# Patient Record
Sex: Female | Born: 1950
Health system: Southern US, Community
[De-identification: ages and names within clinical notes are randomized; demographics above are authoritative.]

## PROBLEM LIST (undated history)

## (undated) DIAGNOSIS — I10 Essential (primary) hypertension: Secondary | ICD-10-CM

## (undated) DIAGNOSIS — M4800 Spinal stenosis, site unspecified: Secondary | ICD-10-CM

## (undated) DIAGNOSIS — F329 Major depressive disorder, single episode, unspecified: Secondary | ICD-10-CM

## (undated) DIAGNOSIS — F32A Depression, unspecified: Secondary | ICD-10-CM

## (undated) DIAGNOSIS — K219 Gastro-esophageal reflux disease without esophagitis: Secondary | ICD-10-CM

## (undated) HISTORY — PX: OTHER SURGICAL HISTORY: SHX169

## (undated) HISTORY — DX: Depression, unspecified: F32.A

## (undated) HISTORY — DX: Gastro-esophageal reflux disease without esophagitis: K21.9

---
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
## 2016-03-02 DIAGNOSIS — C189 Malignant neoplasm of colon, unspecified: Secondary | ICD-10-CM

## 2016-03-02 HISTORY — DX: Malignant neoplasm of colon, unspecified: C18.9

## 2019-08-23 DIAGNOSIS — E7800 Pure hypercholesterolemia, unspecified: Secondary | ICD-10-CM | POA: Diagnosis not present

## 2019-08-23 DIAGNOSIS — F322 Major depressive disorder, single episode, severe without psychotic features: Secondary | ICD-10-CM | POA: Diagnosis not present

## 2019-08-23 DIAGNOSIS — Z79899 Other long term (current) drug therapy: Secondary | ICD-10-CM | POA: Diagnosis not present

## 2019-08-23 DIAGNOSIS — G4700 Insomnia, unspecified: Secondary | ICD-10-CM | POA: Diagnosis not present

## 2019-09-07 DIAGNOSIS — E7800 Pure hypercholesterolemia, unspecified: Secondary | ICD-10-CM | POA: Diagnosis not present

## 2019-09-07 DIAGNOSIS — Z23 Encounter for immunization: Secondary | ICD-10-CM | POA: Diagnosis not present

## 2019-09-07 DIAGNOSIS — Z79899 Other long term (current) drug therapy: Secondary | ICD-10-CM | POA: Diagnosis not present

## 2019-09-22 DIAGNOSIS — M5416 Radiculopathy, lumbar region: Secondary | ICD-10-CM | POA: Diagnosis not present

## 2019-11-09 DIAGNOSIS — G5601 Carpal tunnel syndrome, right upper limb: Secondary | ICD-10-CM | POA: Diagnosis not present

## 2019-11-09 DIAGNOSIS — Z0000 Encounter for general adult medical examination without abnormal findings: Secondary | ICD-10-CM | POA: Diagnosis not present

## 2019-11-09 DIAGNOSIS — K219 Gastro-esophageal reflux disease without esophagitis: Secondary | ICD-10-CM | POA: Diagnosis not present

## 2020-01-06 ENCOUNTER — Encounter: Payer: Self-pay | Admitting: Hematology

## 2020-01-06 ENCOUNTER — Telehealth: Payer: Self-pay | Admitting: Hematology

## 2020-01-06 NOTE — Telephone Encounter (Signed)
Received a new pt referral from Dr. Olen Pel for hx of rectal cancer. I attempted to call Penny Guzman but her line was busy. I schedule her to see Dr. Burr Medico on 2/18 at 3pm. Letter mailed.

## 2020-01-14 ENCOUNTER — Telehealth: Payer: Self-pay

## 2020-01-14 NOTE — Telephone Encounter (Signed)
Ms Shenton called stating that she "did not need this appointment" she just needs the doctor to order her ct scans.  I told her that she would need to see Dr. Burr Medico to establish as a patient for Dr. Burr Medico to order the Ct scans.  She stated that was just wasting her money.

## 2020-01-20 ENCOUNTER — Ambulatory Visit: Payer: Self-pay | Admitting: Hematology

## 2020-01-24 DIAGNOSIS — R1310 Dysphagia, unspecified: Secondary | ICD-10-CM | POA: Diagnosis not present

## 2020-01-24 DIAGNOSIS — Z85048 Personal history of other malignant neoplasm of rectum, rectosigmoid junction, and anus: Secondary | ICD-10-CM | POA: Diagnosis not present

## 2020-01-24 DIAGNOSIS — K219 Gastro-esophageal reflux disease without esophagitis: Secondary | ICD-10-CM | POA: Diagnosis not present

## 2020-02-01 ENCOUNTER — Other Ambulatory Visit: Payer: Self-pay | Admitting: Physician Assistant

## 2020-02-01 DIAGNOSIS — R1310 Dysphagia, unspecified: Secondary | ICD-10-CM

## 2020-02-03 ENCOUNTER — Telehealth: Payer: Self-pay | Admitting: Nurse Practitioner

## 2020-02-03 ENCOUNTER — Telehealth: Payer: Self-pay | Admitting: *Deleted

## 2020-02-03 ENCOUNTER — Ambulatory Visit: Payer: Self-pay | Admitting: Nurse Practitioner

## 2020-02-03 NOTE — Telephone Encounter (Signed)
I received a msg to r/s Penny Guzman's appt. She's been cld and rescheduled to see Lacie on 3/22 at 1:45pm.

## 2020-02-03 NOTE — Telephone Encounter (Signed)
Patient returned my call and confirmed that she understood that her appointment today is being rescheduled.  I informed her that someone from scheduling will be contacting her to reschedule her appointment.  Patient was understanding and appreciative of the call.

## 2020-02-03 NOTE — Telephone Encounter (Signed)
I called patient two times this am with no answer, to let her know that we need to reschedule her appointment with NP at 1:45pm today.  After second attempt, I left a voice mail that we need to reschedule her appointment and to call me for more information.

## 2020-02-16 ENCOUNTER — Other Ambulatory Visit: Payer: Medicare Other

## 2020-02-17 ENCOUNTER — Other Ambulatory Visit: Payer: Medicare Other

## 2020-02-21 ENCOUNTER — Inpatient Hospital Stay: Payer: Medicare Other

## 2020-02-21 ENCOUNTER — Other Ambulatory Visit: Payer: Self-pay

## 2020-02-21 ENCOUNTER — Encounter: Payer: Self-pay | Admitting: Nurse Practitioner

## 2020-02-21 ENCOUNTER — Inpatient Hospital Stay: Payer: Medicare Other | Attending: Hematology | Admitting: Nurse Practitioner

## 2020-02-21 VITALS — BP 153/95 | HR 80 | Temp 98.2°F | Resp 20 | Wt 178.7 lb

## 2020-02-21 DIAGNOSIS — Z85048 Personal history of other malignant neoplasm of rectum, rectosigmoid junction, and anus: Secondary | ICD-10-CM

## 2020-02-21 DIAGNOSIS — Z79899 Other long term (current) drug therapy: Secondary | ICD-10-CM | POA: Insufficient documentation

## 2020-02-21 DIAGNOSIS — K219 Gastro-esophageal reflux disease without esophagitis: Secondary | ICD-10-CM | POA: Diagnosis not present

## 2020-02-21 DIAGNOSIS — Z9221 Personal history of antineoplastic chemotherapy: Secondary | ICD-10-CM | POA: Insufficient documentation

## 2020-02-21 DIAGNOSIS — N895 Stricture and atresia of vagina: Secondary | ICD-10-CM | POA: Insufficient documentation

## 2020-02-21 DIAGNOSIS — Z1231 Encounter for screening mammogram for malignant neoplasm of breast: Secondary | ICD-10-CM

## 2020-02-21 DIAGNOSIS — C210 Malignant neoplasm of anus, unspecified: Secondary | ICD-10-CM | POA: Diagnosis not present

## 2020-02-21 DIAGNOSIS — F329 Major depressive disorder, single episode, unspecified: Secondary | ICD-10-CM | POA: Diagnosis not present

## 2020-02-21 DIAGNOSIS — G4700 Insomnia, unspecified: Secondary | ICD-10-CM | POA: Insufficient documentation

## 2020-02-21 DIAGNOSIS — Z923 Personal history of irradiation: Secondary | ICD-10-CM | POA: Insufficient documentation

## 2020-02-21 LAB — CMP (CANCER CENTER ONLY)
ALT: 38 U/L (ref 0–44)
AST: 27 U/L (ref 15–41)
Albumin: 3.7 g/dL (ref 3.5–5.0)
Alkaline Phosphatase: 106 U/L (ref 38–126)
Anion gap: 11 (ref 5–15)
BUN: 17 mg/dL (ref 8–23)
CO2: 24 mmol/L (ref 22–32)
Calcium: 9.3 mg/dL (ref 8.9–10.3)
Chloride: 107 mmol/L (ref 98–111)
Creatinine: 0.77 mg/dL (ref 0.44–1.00)
GFR, Est AFR Am: >60 mL/min (ref >60)
GFR, Estimated: >60 mL/min (ref >60)
Glucose, Bld: 81 mg/dL (ref 70–99)
Potassium: 4.2 mmol/L (ref 3.5–5.1)
Sodium: 142 mmol/L (ref 135–145)
Total Bilirubin: 0.7 mg/dL (ref 0.3–1.2)
Total Protein: 6.6 g/dL (ref 6.5–8.1)

## 2020-02-21 LAB — CBC WITH DIFFERENTIAL (CANCER CENTER ONLY)
Abs Immature Granulocytes: 0.01 10*3/uL (ref 0.00–0.07)
Basophils Absolute: 0 10*3/uL (ref 0.0–0.1)
Basophils Relative: 1 %
Eosinophils Absolute: 0.2 10*3/uL (ref 0.0–0.5)
Eosinophils Relative: 5 %
HCT: 43 % (ref 36.0–46.0)
Hemoglobin: 14.2 g/dL (ref 12.0–15.0)
Immature Granulocytes: 0 %
Lymphocytes Relative: 23 %
Lymphs Abs: 1.1 10*3/uL (ref 0.7–4.0)
MCH: 31.3 pg (ref 26.0–34.0)
MCHC: 33 g/dL (ref 30.0–36.0)
MCV: 94.9 fL (ref 80.0–100.0)
Monocytes Absolute: 0.6 10*3/uL (ref 0.1–1.0)
Monocytes Relative: 13 %
Neutro Abs: 2.7 10*3/uL (ref 1.7–7.7)
Neutrophils Relative %: 58 %
Platelet Count: 288 10*3/uL (ref 150–400)
RBC: 4.53 MIL/uL (ref 3.87–5.11)
RDW: 13 % (ref 11.5–15.5)
WBC Count: 4.6 10*3/uL (ref 4.0–10.5)
nRBC: 0 % (ref 0.0–0.2)

## 2020-02-21 NOTE — Progress Notes (Addendum)
[facility]  [phone] Fax:([phone] [place] Note   Patient Care Team: Orpah Melter, MD as PCP - General (Family Medicine) Date of Service: 02/21/2020   CHIEF COMPLAINTS/PURPOSE OF CONSULTATION:  H/o anal cancer in 2017, Referred by Dr. Orpah Melter of [facility]   Oncology History  Anal cancer [facility])  03/15/2016 Procedure   FINDINGS:  Approximately 4cm nodular inflamed area in rectum just above  dentate line.  Biopsies taken with cold forceps.  To rule out  malignancy. Site marked with ink.  Rest of colon appeared normal IMPRESSION: Nodular inflamed area just above dentate line.  To  rule out malignancy   03/15/2016 Initial Biopsy   FINAL PATHOLOGIC DIAGNOSIS RECTUM, BIOPSY: -AT LEAST  SQUAMOUS CELL CARCINOMA IN SITU, SEE COMMENT  Comment There is no definite evidence of invasion in the sections examined. Clinical correlation and a repeat biopsy may be done if there is a suspicion for invasive carcinoma.   03/15/2016 Initial Diagnosis   Anal cancer ([place])   03/15/2016 Cancer Staging   Cancer Staging Anal cancer [facility]) Staging form: Anus, AJCC 8th Edition - Clinical stage from 02/22/2020: Stage IIIC (cT3, cN1, cM0) - Signed by Alla Feeling, NP on 02/22/2020    03/19/2016 Imaging   Staging CT CAP IMPRESSION 1. Gallstones. 2. Aortic atherosclerosis and branches, mild. 3. Degenerative disc disease L2-S1. 4. Otherwise negative CT chest abdomen and pelvis.   03/26/2016 PET scan   IMPRESSION 1. Intense uptake noted in the distal rectum and anus consistent with malignancy. 2. 1 cm mildly hypermetabolic (max SUV of 2.0) with a lymph node in the right mesorectal fascia which could represent reactive versus pathologic node.   04/05/2016 Imaging   Local staging pelvic MRI IMPRESSION A 5 cm anal tumor with invasion of the sphincter complex and lower rectum as described above (T3). A suspicious 10 mm round mesorectal lymph  node (N1).   04/23/2016 - 05/28/2016 Radiation Therapy   RADIATION THERAPY: Completed 25 of 25 fractions (50 Gy of a planned 50 Gy to anus+pelvis+groins)   04/23/2016 - 05/21/2016 Chemotherapy   CONCURRENT THERAPY: 5-FU and Mitomycin C x 2 cycles     08/14/2016 PET scan   IMPRESSION (post chemoRT PET scan) 1.  There is no definite sign of metabolically active malignancy. Metabolic activity at the site of  previously seen hypermetabolic anal mass is mild and diffuse and appears physiologic. Previously seen  hypermetabolic right sacral node is no longer present.  2.  Incidental CT findings including: Coronary artery and aortic atherosclerosis, cholelithiasis .   09/05/2017 Imaging   Surveillance CT CAP IMPRESSION No evidence of metastatic disease.   09/11/2018 Procedure   Colonoscopy Data Digital Rectal Exam: Normal Quality of Prep: Good Extent of procedure: Cecum Withdrawal Time (Cecum to Anus): 7 mins Retroflexed View Performed: Yes  Colonoscopy Findings/Specimens Bottle #: 1 Location: Descending Colon Specimen Type: Polyp Total number of polyps: 1 Polyp Size (mm): less than 5 mm Type: Sessile Tissue extracted by: Cold Forceps Extraction Comments: (09/11/18 cbs/ka 1350 hx of polyps and anal  ca)     09/14/2018 Pathology Results   FINAL PATHOLOGIC DIAGNOSIS COLON, DESCENDING POLYP, MUCOSAL BIOPSY:       BENIGN COLONIC MUCOSA WITH REACTIVE LYMPHOID AGGREGATE. Comment No micro-organisms, active colitis, dysplasia, or neoplasia seen.   11/13/2018 Imaging   Surveillance CT CAP  Stable CT examination of the chest, abdomen and pelvis compared to 11/05/2017 without metastatic process identified. Clinical follow up according  to existing care plan.  Cholelithiasis.  Aortic atherosclerosis.   02/22/2020 Cancer Staging   Staging form: Anus, AJCC 8th Edition - Clinical stage from 02/22/2020: Stage IIIC (cT3, cN1, cM0) - Signed by Alla Feeling, NP on 02/22/2020    HISTORY OF  PRESENTING ILLNESS:  Penny Guzman 69 y.o. female is here because of history of anal cancer treated at [facility]. She underwent screening colonoscopy on 03/15/2016. There was a 4 cm nodular inflamed areas in the rectum just above the dentate line. Path showed at least squamous cell carcinoma in situ, no definite evidence of invasion in the sections examined. Staging CT CAP on 03/19/2016 showed no distant metastasis. PET on 03/26/2016 showed intense hypermetabolic uptake in the distal rectum and anus, consistent with known malignancy and 1 cm mildly hypermetabolic LN in the right mesorectal fascia. Pelvic MRI on 04/05/2016 showed a 5 cm anal tumor with invasion of the sphincter complex and lower rectum and suspicious 10 mm round mesorectal node, locally staged T3N1, stage IIIA. She completed chemoradiation with 5FU and Mitomycin. She received 50 Gy in 25 fractions to the anus/pelvis/groins from 04/23/2016 to 05/28/2016. PET on 08/14/2016 showed no signs of metabolically active malignancy and resolution of the previous right sacral node. Surveillance CT CAP on 09/05/2017 showed NED. Colonoscopy on 09/11/2018 revealed a normal DRE and 1 polyp in the descending colon. Path showed benign colonic mucosa with reactive lymphoid aggregate. Surveillance CT CAP on 11/13/2018 showed no recurrence or metastatic cancer.   Socially, she relocated to [place], [place] from [place] in 07/2019 during COVID-19 pandemic to be closer to family. She lives with her spouse. She is a retired Pharmacist, hospital. She had 3 children, a daughter is deceased from AML at age 15, a son and daughter are living and healthy. She is UTD on PAP smear in 2019. Last mammogram in 2018. She has family history of cancer in father with bladder, mother with skin cancer. She drinks alcohol socially with 3 drinks per week; denies tobacco or other drug history.  Ms. Bona presents today by herself. She is doing well. Her appetite and weight are  normal for her. She is fatigued but attributes that to cross-country move during global pandemic. Her bowels are consistent, takes colace. Denies n/v/d. Has some mucus leakage and not full control of gas, no bleeding. Denies rectal/anal pain but has itching. Overactive bladder is at baseline. She has vaginal stenosis. Mood is good lately. Denies recent fever, chills, cough, chest pain, dyspnea, swelling.   MEDICAL HISTORY:  Past Medical History:  Diagnosis Date  . Colon cancer ([place]) 03/2016   anal cancer  . Depression   . GERD (gastroesophageal reflux disease)     SURGICAL HISTORY: Past Surgical History:  Procedure Laterality Date  . ovarian cysts      SOCIAL HISTORY: Social History   Socioeconomic History  . Marital status: Married    Spouse name: Not on file  . Number of children: Not on file  . Years of education: Not on file  . Highest education level: Not on file  Occupational History  . Occupation: Pharmacist, hospital    Comment: Retired  Tobacco Use  . Smoking status: Never Smoker  . Smokeless tobacco: Never Used  Substance and Sexual Activity  . Alcohol use: Yes    Comment: 3 drinks per week   . Drug use: Never  . Sexual activity: Not Currently  Other Topics Concern  . Not on file  Social History Narrative  .  Not on file   Social Determinants of Health   Financial Resource Strain:   . Difficulty of Paying Living Expenses:   Food Insecurity:   . Worried About Charity fundraiser in the Last Year:   . Arboriculturist in the Last Year:   Transportation Needs:   . Film/video editor (Medical):   Marland Kitchen Lack of Transportation (Non-Medical):   Physical Activity:   . Days of Exercise per Week:   . Minutes of Exercise per Session:   Stress:   . Feeling of Stress :   Social Connections:   . Frequency of Communication with Friends and Family:   . Frequency of Social Gatherings with Friends and Family:   . Attends Religious Services:   . Active Member of Clubs or  Organizations:   . Attends Archivist Meetings:   Marland Kitchen Marital Status:   Intimate Partner Violence:   . Fear of Current or Ex-Partner:   . Emotionally Abused:   Marland Kitchen Physically Abused:   . Sexually Abused:     FAMILY HISTORY: Family History  Problem Relation Age of Onset  . Cancer Mother        skin  . Cancer Father        bladder  . Cancer Daughter        AML, deceased age 110    ALLERGIES:  is allergic to codeine; propoxyphene; and triamcinolone.  MEDICATIONS:  Current Outpatient Medications  Medication Sig Dispense Refill  . aspirin 81 MG chewable tablet Chew by mouth daily.    Marland Kitchen atorvastatin (LIPITOR) 20 MG tablet Take 20 mg by mouth daily.    Marland Kitchen augmented betamethasone dipropionate (DIPROLENE-AF) 0.05 % cream Apply 30 g topically as needed.    Marland Kitchen b complex vitamins tablet Take 1 tablet by mouth daily.    . calcium-vitamin D (OSCAL WITH D) 500-200 MG-UNIT tablet Take 1 tablet by mouth.    . co-enzyme Q-10 30 MG capsule Take 30 mg by mouth 3 (three) times daily.    Marland Kitchen FLUoxetine (PROZAC) 10 MG capsule Take 10 mg by mouth daily.    Marland Kitchen ibuprofen (ADVIL) 200 MG tablet Take 200 mg by mouth daily. 250 mg daily    . omeprazole (PRILOSEC) 20 MG capsule Take 20 mg by mouth daily.    . temazepam (RESTORIL) 15 MG capsule Take 15 mg by mouth at bedtime as needed.     No current facility-administered medications for this visit.    REVIEW OF SYSTEMS:   Constitutional: Denies fevers, chills or abnormal night sweats Eyes: Denies blurriness of vision, double vision or watery eyes Ears, nose, mouth, throat, and face: Denies mucositis or sore throat Respiratory: Denies cough, dyspnea or wheezes Cardiovascular: Denies palpitation, chest discomfort or lower extremity swelling Gastrointestinal:  Denies nausea, vomiting or change in bowel habits (+) constipation (+) rectal itching (+) gas (+) GERD GU/GYN: (+) overactive bladder (+) vaginal stenosis  Skin: Denies abnormal skin  rashes Lymphatics: Denies new lymphadenopathy or easy bruising Neurological:Denies numbness, tingling or new weaknesses Behavioral/Psych: Mood is stable, no new changes  All other systems were reviewed with the patient and are negative.  PHYSICAL EXAMINATION: ECOG PERFORMANCE STATUS: 0 - Asymptomatic  Vitals:   02/21/20 1422  BP: (!) 153/95  Pulse: 80  Resp: 20  Temp: 98.2 F (36.8 C)  SpO2: 98%   Filed Weights   02/21/20 1422  Weight: 178 lb 11.2 oz (81.1 kg)    GENERAL:alert, no distress and comfortable  SKIN: no rash EYES: sclera clear LYMPH:  no palpable cervical, supraclavicular, or inguinal lymphadenopathy LUNGS: clear with normal breathing effort HEART: regular rate & rhythm, no lower extremity edema ABDOMEN: abdomen soft, non-tender and normal bowel sounds RECTAL: external exam shows no mass. Skin intact. Normal sphincter tone. No internal anorectal mass that I could appreciate. No blood on glove.  PSYCH: alert & oriented x 3 with fluent speech NEURO: no focal motor/sensory deficits  LABORATORY DATA:  I have reviewed the data as listed CBC Latest Ref Rng & Units 02/21/2020  WBC 4.0 - 10.5 K/uL 4.6  Hemoglobin 12.0 - 15.0 g/dL 14.2  Hematocrit 36.0 - 46.0 % 43.0  Platelets 150 - 400 K/uL 288   CMP Latest Ref Rng & Units 02/21/2020  Glucose 70 - 99 mg/dL 81  BUN 8 - 23 mg/dL 17  Creatinine 0.44 - 1.00 mg/dL 0.77  Sodium 135 - 145 mmol/L 142  Potassium 3.5 - 5.1 mmol/L 4.2  Chloride 98 - 111 mmol/L 107  CO2 22 - 32 mmol/L 24  Calcium 8.9 - 10.3 mg/dL 9.3  Total Protein 6.5 - 8.1 g/dL 6.6  Total Bilirubin 0.3 - 1.2 mg/dL 0.7  Alkaline Phos 38 - 126 U/L 106  AST 15 - 41 U/L 27  ALT 0 - 44 U/L 38     RADIOGRAPHIC STUDIES: I have personally reviewed the radiological images as listed and agreed with the findings in the report. No results found.  ASSESSMENT & PLAN: 69 yo female with   1. H/o squamous cell carcinoma of the anus, cT3N1 stage IIIA -We  reviewed her medical record in detail including colonoscopy, biopsy, and imaging. She was diagnosed with clinical T3N1 locally advanced anal cancer in 03/2016 on routine screening colonoscopy. She underwent curative chemoRT with 5FU/mitomycin. Follow up imaging and colonoscopy (2019) showed no evidence of residual or recurrent disease.  -She is on surveillance, remains NED, last surveillance CT CAP on 11/2018.  -Ms. Paccione is doing well. CBC and CMP are normal today. Physical exam including rectal shows no concern for recurrence.  -She has vaginal stenosis from previous radiation, PT was not able to help her in the past. I have referred her to Gyn. -For her h/o stage IIIA anal cancer, she is being referred for last surveillance CT CAP.  -she will return for lab and f/u in 6 months to continue surveillance.   2. GERD, HL, Depression, insomnia -Continue medical management and f/u with PCP  -Mood is stable   3. Health maintenance -I encouraged her to remain active with physical exercise and healthy diet and lifestyle.  -She is UTD on PAP smear. Last mammogram in 2018 in [place]. I reviewed rationale for annual screening. She agrees to repeat mammogram. I placed the order today.  -She has been referred to GI by her PCP Dr. Olen Pel at [place]: -Medical record reviewed -Lab today, WNL  -Surveillance CT CAP in 1-2 weeks, will call her after to review results -Referral to Ob/Gyn for vaginal stenosis  -Referred for screening mammogram, patient to call breast center  -Lab and surveillance visit in 6 months    Orders Placed This Encounter  Procedures  . CT Abdomen Pelvis W Contrast    Standing Status:   Future    Standing Expiration Date:   02/20/2021    Order Specific Question:   ** REASON FOR EXAM (FREE TEXT)    Answer:   Surveillance, diagnosed 03/2016 s/p chemoradiation    Order Specific Question:  If indicated for the ordered procedure, I authorize the administration of contrast media  per Radiology protocol    Answer:   Yes    Order Specific Question:   Preferred imaging location?    Answer:   [facility]    Order Specific Question:   Is Oral Contrast requested for this exam?    Answer:   Yes, Per Radiology protocol    Order Specific Question:   Radiology Contrast Protocol - do NOT remove file path    Answer:   \\[email or web address]  . CT Chest W Contrast    Standing Status:   Future    Standing Expiration Date:   02/20/2021    Order Specific Question:   ** REASON FOR EXAM (FREE TEXT)    Answer:   surveillance    Order Specific Question:   If indicated for the ordered procedure, I authorize the administration of contrast media per Radiology protocol    Answer:   Yes    Order Specific Question:   Preferred imaging location?    Answer:   [facility]    Order Specific Question:   Radiology Contrast Protocol - do NOT remove file path    Answer:   \\[email or web address]  . MM 3D SCREEN BREAST BILATERAL    Standing Status:   Future    Standing Expiration Date:   04/22/2021    Order Specific Question:   Reason for Exam (SYMPTOM  OR DIAGNOSIS REQUIRED)    Answer:   overdue screening mammogram, last 2018 in [place] Specific Question:   Preferred imaging location?    Answer:   [facility]  . CBC with Differential (Cancer Center Only)    Standing Status:   Future    Number of Occurrences:   1    Standing Expiration Date:   02/20/2021  . CMP ([place] only)    Standing Status:   Future    Number of Occurrences:   1    Standing Expiration Date:   02/20/2021  . Ambulatory referral to Obstetrics / Gynecology    Referral Priority:   Routine    Referral Type:   Consultation    Referral Reason:   Specialty Services Required    Requested Specialty:   Obstetrics and Gynecology    Number of Visits Requested:   1    All questions were answered. The patient knows to call the clinic with any  problems, questions or concerns.     Alla Feeling, NP 02/22/2020   Addendum  I have seen the patient, examined her. I agree with the assessment and and plan and have edited the notes.   68 yo female with T3N1M0 stage IIIA anal cancer diagnosed in May 2017, s/p concurrent chemoRT and achieved complete response, on surveillance. She is clinically doing well, no concerns for recurrence. Will do lab and last surveillance CT CAP with contrast in next few weeks. RTC in 6 months.   Truitt Merle  02/21/2020

## 2020-02-22 ENCOUNTER — Encounter: Payer: Self-pay | Admitting: Nurse Practitioner

## 2020-02-22 ENCOUNTER — Telehealth: Payer: Self-pay | Admitting: Nurse Practitioner

## 2020-02-22 DIAGNOSIS — C210 Malignant neoplasm of anus, unspecified: Secondary | ICD-10-CM | POA: Insufficient documentation

## 2020-02-22 NOTE — Telephone Encounter (Signed)
Scheduled appt per 3/22 los.  Sent a message to HIM pool to get a calendar mailed out.

## 2020-02-23 ENCOUNTER — Telehealth: Payer: Self-pay | Admitting: *Deleted

## 2020-02-23 NOTE — Telephone Encounter (Signed)
-----   Message from Alla Feeling, NP sent at 02/22/2020  4:51 PM EDT ----- Please let her know CBC and CMP all normal, no concerns with her labs.  Thanks, Regan Rakers

## 2020-02-23 NOTE — Telephone Encounter (Signed)
Per Cira Rue NP, called to make pt aware of normal labs of CBC and CMP. Voice message left on pt personal cell. Advised to call office with any questions or concerns.

## 2020-03-08 DIAGNOSIS — G5600 Carpal tunnel syndrome, unspecified upper limb: Secondary | ICD-10-CM | POA: Diagnosis not present

## 2020-03-31 ENCOUNTER — Ambulatory Visit (HOSPITAL_COMMUNITY)
Admission: RE | Admit: 2020-03-31 | Discharge: 2020-03-31 | Disposition: A | Discharge status: Home / Self Care | Payer: Medicare Other | Source: Ambulatory Visit | Attending: Nurse Practitioner | Admitting: Nurse Practitioner

## 2020-03-31 ENCOUNTER — Other Ambulatory Visit: Payer: Self-pay

## 2020-03-31 DIAGNOSIS — C210 Malignant neoplasm of anus, unspecified: Secondary | ICD-10-CM | POA: Diagnosis not present

## 2020-03-31 DIAGNOSIS — Z85048 Personal history of other malignant neoplasm of rectum, rectosigmoid junction, and anus: Secondary | ICD-10-CM | POA: Insufficient documentation

## 2020-03-31 DIAGNOSIS — K8020 Calculus of gallbladder without cholecystitis without obstruction: Secondary | ICD-10-CM | POA: Diagnosis not present

## 2020-03-31 DIAGNOSIS — Z923 Personal history of irradiation: Secondary | ICD-10-CM | POA: Diagnosis not present

## 2020-03-31 DIAGNOSIS — I2510 Atherosclerotic heart disease of native coronary artery without angina pectoris: Secondary | ICD-10-CM | POA: Insufficient documentation

## 2020-03-31 DIAGNOSIS — I700 Atherosclerosis of aorta: Secondary | ICD-10-CM | POA: Insufficient documentation

## 2020-03-31 MED ORDER — SODIUM CHLORIDE (PF) 0.9 % IJ SOLN
INTRAMUSCULAR | Status: AC
  Filled 2020-03-31: qty 50

## 2020-03-31 MED ORDER — IOHEXOL 300 MG/ML  SOLN
100.0000 mL | Freq: Once | INTRAMUSCULAR | Status: AC | PRN
  Administered 2020-03-31: 11:00:00 100 mL via INTRAVENOUS

## 2020-04-03 ENCOUNTER — Telehealth: Payer: Self-pay | Admitting: *Deleted

## 2020-04-03 NOTE — Telephone Encounter (Signed)
Per Cira Rue, NP, called to make pt aware of negative CT CAP for recurrent or metastatic anal cancer. Advised to f/u as scheduled for 9/21 and/or sooner if there were and concerns. Pt verbalized understanding and was appreciative

## 2020-04-03 NOTE — Telephone Encounter (Signed)
-----   Message from Alla Feeling, NP sent at 04/02/2020  9:15 PM EDT ----- Please let her know CT CAP is negative for recurrent or metastatic anal cancer. We'll see her back in 08/2020 as planned, or sooner if she has concerns.   Thanks, Regan Rakers

## 2020-04-05 ENCOUNTER — Ambulatory Visit
Admission: RE | Admit: 2020-04-05 | Discharge: 2020-04-05 | Disposition: A | Discharge status: Home / Self Care | Payer: Medicare Other | Source: Ambulatory Visit | Attending: Nurse Practitioner | Admitting: Nurse Practitioner

## 2020-04-05 ENCOUNTER — Other Ambulatory Visit: Payer: Self-pay

## 2020-04-05 DIAGNOSIS — Z1231 Encounter for screening mammogram for malignant neoplasm of breast: Secondary | ICD-10-CM | POA: Diagnosis not present

## 2020-06-02 DIAGNOSIS — F418 Other specified anxiety disorders: Secondary | ICD-10-CM | POA: Diagnosis not present

## 2020-06-02 DIAGNOSIS — M5416 Radiculopathy, lumbar region: Secondary | ICD-10-CM | POA: Diagnosis not present

## 2020-06-20 DIAGNOSIS — M5136 Other intervertebral disc degeneration, lumbar region: Secondary | ICD-10-CM | POA: Diagnosis not present

## 2020-07-29 DIAGNOSIS — M5136 Other intervertebral disc degeneration, lumbar region: Secondary | ICD-10-CM | POA: Diagnosis not present

## 2020-08-18 ENCOUNTER — Other Ambulatory Visit: Payer: Self-pay | Admitting: Physical Medicine and Rehabilitation

## 2020-08-18 DIAGNOSIS — M545 Low back pain, unspecified: Secondary | ICD-10-CM

## 2020-08-23 ENCOUNTER — Other Ambulatory Visit: Payer: Self-pay | Admitting: Nurse Practitioner

## 2020-08-23 DIAGNOSIS — Z85048 Personal history of other malignant neoplasm of rectum, rectosigmoid junction, and anus: Secondary | ICD-10-CM

## 2020-08-23 NOTE — Progress Notes (Signed)
[place]   [phone] Fax:([phone]) [phone]   Clinic Follow up Note   Patient Care Team: Orpah Melter, MD as PCP - General (Family Medicine)  Date of Service:  08/24/2020  CHIEF COMPLAINT: F/u of H/o anal cancer   SUMMARY OF ONCOLOGIC HISTORY: Oncology History Overview Note  Cancer Staging Anal cancer [facility]) Staging form: Anus, AJCC 8th Edition - Clinical stage from 02/22/2020: Stage IIIC (cT3, cN1, cM0) - Signed by Alla Feeling, NP on 02/22/2020    Anal cancer ([place])  03/15/2016 Procedure   FINDINGS:  Approximately 4cm nodular inflamed area in rectum just above  dentate line.  Biopsies taken with cold forceps.  To rule out  malignancy. Site marked with ink.  Rest of colon appeared normal IMPRESSION: Nodular inflamed area just above dentate line.  To  rule out malignancy   03/15/2016 Initial Biopsy   FINAL PATHOLOGIC DIAGNOSIS RECTUM, BIOPSY: -AT LEAST  SQUAMOUS CELL CARCINOMA IN SITU, SEE COMMENT  Comment There is no definite evidence of invasion in the sections examined. Clinical correlation and a repeat biopsy may be done if there is a suspicion for invasive carcinoma.   03/15/2016 Initial Diagnosis   Anal cancer ([place])   03/15/2016 Cancer Staging   Cancer Staging Anal cancer [facility]) Staging form: Anus, AJCC 8th Edition - Clinical stage from 02/22/2020: Stage IIIC (cT3, cN1, cM0) - Signed by Alla Feeling, NP on 02/22/2020    03/19/2016 Imaging   Staging CT CAP IMPRESSION 1. Gallstones. 2. Aortic atherosclerosis and branches, mild. 3. Degenerative disc disease L2-S1. 4. Otherwise negative CT chest abdomen and pelvis.   03/26/2016 PET scan   IMPRESSION 1. Intense uptake noted in the distal rectum and anus consistent with malignancy. 2. 1 cm mildly hypermetabolic (max SUV of 2.0) with a lymph node in the right mesorectal fascia which could represent reactive versus pathologic node.   04/05/2016 Imaging   Local staging pelvic MRI  IMPRESSION A 5 cm anal tumor with invasion of the sphincter complex and lower rectum as described above (T3). A suspicious 10 mm round mesorectal lymph node (N1).   04/23/2016 - 05/28/2016 Radiation Therapy   RADIATION THERAPY: Completed 25 of 25 fractions (50 Gy of a planned 50 Gy to anus+pelvis+groins)   04/23/2016 - 05/21/2016 Chemotherapy   CONCURRENT THERAPY: 5-FU and Mitomycin C x 2 cycles     08/14/2016 PET scan   IMPRESSION (post chemoRT PET scan) 1.  There is no definite sign of metabolically active malignancy. Metabolic activity at the site of  previously seen hypermetabolic anal mass is mild and diffuse and appears physiologic. Previously seen  hypermetabolic right sacral node is no longer present.  2.  Incidental CT findings including: Coronary artery and aortic atherosclerosis, cholelithiasis .   09/05/2017 Imaging   Surveillance CT CAP IMPRESSION No evidence of metastatic disease.   09/11/2018 Procedure   Colonoscopy Data Digital Rectal Exam: Normal Quality of Prep: Good Extent of procedure: Cecum Withdrawal Time (Cecum to Anus): 7 mins Retroflexed View Performed: Yes  Colonoscopy Findings/Specimens Bottle #: 1 Location: Descending Colon Specimen Type: Polyp Total number of polyps: 1 Polyp Size (mm): less than 5 mm Type: Sessile Tissue extracted by: Cold Forceps Extraction Comments: (09/11/18 cbs/ka 1350 hx of polyps and anal  ca)     09/14/2018 Pathology Results   FINAL PATHOLOGIC DIAGNOSIS COLON, DESCENDING POLYP, MUCOSAL BIOPSY:       BENIGN COLONIC MUCOSA WITH REACTIVE LYMPHOID AGGREGATE. Comment No micro-organisms, active colitis, dysplasia, or neoplasia  seen.   11/13/2018 Imaging   Surveillance CT CAP  Stable CT examination of the chest, abdomen and pelvis compared to 11/05/2017 without metastatic process identified. Clinical follow up according to existing care plan.  Cholelithiasis.  Aortic atherosclerosis.   02/22/2020 Cancer Staging   Staging  form: Anus, AJCC 8th Edition - Clinical stage from 02/22/2020: Stage IIIC (cT3, cN1, cM0) - Signed by Alla Feeling, NP on 02/22/2020   03/31/2020 Imaging   CT CAP  IMPRESSION: Chest Impression:   1. No evidence of pulmonary metastasis. 2. Coronary artery calcification and Aortic Atherosclerosis (ICD10-I70.0).   Abdomen / Pelvis Impression:   1. No evidence local anal carcinoma recurrence. 2. No metastatic adenopathy or visceral metastasis. 3. Cholelithiasis without evidence cholecystitis. 4.  Aortic Atherosclerosis (ICD10-I70.0).      CURRENT THERAPY:  Surveillance   INTERVAL HISTORY:  Penny Guzman is here for a follow up. She was last seen by NP Lacie 6 months ago. She presents to the clinic alone.  She is doing well overall, mild chronic abdominal bloating, gasy feeling is stable, no N/V, BM is irregular, constipation and diarrhea sometime. This has been since her chemoRT.    MEDICAL HISTORY:  Past Medical History:  Diagnosis Date   Colon cancer ([place]) 03/2016   anal cancer   Depression    GERD (gastroesophageal reflux disease)     SURGICAL HISTORY: Past Surgical History:  Procedure Laterality Date   ovarian cysts      I have reviewed the social history and family history with the patient and they are unchanged from previous note.  ALLERGIES:  is allergic to codeine, propoxyphene, and triamcinolone.  MEDICATIONS:  Current Outpatient Medications  Medication Sig Dispense Refill   aspirin 81 MG chewable tablet Chew by mouth daily.     atorvastatin (LIPITOR) 20 MG tablet Take 20 mg by mouth daily.     augmented betamethasone dipropionate (DIPROLENE-AF) 0.05 % cream Apply 30 g topically as needed.     b complex vitamins tablet Take 1 tablet by mouth daily.     calcium-vitamin D (OSCAL WITH D) 500-200 MG-UNIT tablet Take 1 tablet by mouth.     co-enzyme Q-10 30 MG capsule Take 30 mg by mouth 3 (three) times daily.     diazepam (VALIUM) 5 MG tablet  Take by mouth.     FLUoxetine (PROZAC) 10 MG capsule Take 10 mg by mouth daily.     gabapentin (NEURONTIN) 100 MG capsule Take 200 mg by mouth 2 (two) times daily.     ibuprofen (ADVIL) 200 MG tablet Take 200 mg by mouth daily. 250 mg daily     temazepam (RESTORIL) 15 MG capsule Take 15 mg by mouth at bedtime as needed.     omeprazole (PRILOSEC) 20 MG capsule Take 20 mg by mouth daily.     No current facility-administered medications for this visit.    PHYSICAL EXAMINATION: ECOG PERFORMANCE STATUS: 0 - Asymptomatic  Vitals:   08/24/20 1259 08/24/20 1304  BP: (!) 143/100 (!) 157/84  Pulse: 85   Resp: 18   Temp: 98.6 F (37 C)   SpO2: 97%    Filed Weights   08/24/20 1259  Weight: 184 lb (83.5 kg)   GENERAL:alert, no distress and comfortable SKIN: skin color, texture, turgor are normal, no rashes or significant lesions EYES: normal, Conjunctiva are pink and non-injected, sclera clear NECK: supple, thyroid normal size, non-tender, without nodularity LYMPH:  no palpable lymphadenopathy in the cervical, axillary or inguinal  areas  LUNGS: clear to auscultation and percussion with normal breathing effort HEART: regular rate & rhythm and no murmurs and no lower extremity edema ABDOMEN:abdomen soft, non-tender and normal bowel sounds, patient declined rectal exam. Musculoskeletal:no cyanosis of digits and no clubbing  NEURO: alert & oriented x 3 with fluent speech, no focal motor/sensory deficits  LABORATORY DATA:  I have reviewed the data as listed CBC Latest Ref Rng & Units 08/24/2020 02/21/2020  WBC 4.0 - 10.5 K/uL 4.1 4.6  Hemoglobin 12.0 - 15.0 g/dL 13.9 14.2  Hematocrit 36 - 46 % 41.9 43.0  Platelets 150 - 400 K/uL 264 288     CMP Latest Ref Rng & Units 08/24/2020 02/21/2020  Glucose 70 - 99 mg/dL 100(H) 81  BUN 8 - 23 mg/dL 19 17  Creatinine 0.44 - 1.00 mg/dL 0.82 0.77  Sodium 135 - 145 mmol/L 141 142  Potassium 3.5 - 5.1 mmol/L 4.7 4.2  Chloride 98 - 111 mmol/L 107  107  CO2 22 - 32 mmol/L 28 24  Calcium 8.9 - 10.3 mg/dL 9.6 9.3  Total Protein 6.5 - 8.1 g/dL 6.8 6.6  Total Bilirubin 0.3 - 1.2 mg/dL 0.9 0.7  Alkaline Phos 38 - 126 U/L 126 106  AST 15 - 41 U/L 26 27  ALT 0 - 44 U/L 39 38      RADIOGRAPHIC STUDIES: I have personally reviewed the radiological images as listed and agreed with the findings in the report. No results found.   ASSESSMENT & PLAN:  QIANA LANDGREBE is a 69 y.o. female with   1. H/o squamous cell carcinoma of the anus, cT3N1 stage IIIA -She was diagnosed with clinical T3N1 locally advanced anal cancer in 03/2016 on routine screening colonoscopy. She underwent curative chemoRT with 5FU/mitomycin. Follow up imaging and colonoscopy (2019) showed no evidence of residual or recurrent disease.  -She is on surveillance, remains NED on 03/2020 CT CAP.  -She is clinically doing well, has mild chronic GI symptoms since she completed chemoradiation, no new concerns.  Exam was unremarkable.  Lab reviewed, CBC and CMP are unremarkable. -It has been 4-1/2 years since her initial diagnosis, follow-up in 1 year for last visit. -I do not plan to repeat scans.  2. GERD, HL, Depression, insomnia -Continue medical management and f/u with PCP  -Mood is stable   3. Health maintenance -I encouraged her to remain active with physical exercise and healthy diet and lifestyle.  -Routine mammogram in May 2021 was negative.  Continue annual screening  PLAN: -Lab reviewed, she is doing well clinically, no concern for recurrence -Lab and follow-up with APP in 1 year for last visit -We will copy her PCP Dr. Doyle Askew  No problem-specific Assessment & Plan notes found for this encounter.   No orders of the defined types were placed in this encounter.  All questions were answered. The patient knows to call the clinic with any problems, questions or concerns. No barriers to learning was detected. The total time spent in the appointment was 25  minutes.     Truitt Merle, MD 08/24/2020   I, Joslyn Devon, am acting as scribe for Truitt Merle, MD.   I have reviewed the above documentation for accuracy and completeness, and I agree with the above.

## 2020-08-24 ENCOUNTER — Other Ambulatory Visit: Payer: Self-pay

## 2020-08-24 ENCOUNTER — Inpatient Hospital Stay: Payer: Medicare Other

## 2020-08-24 ENCOUNTER — Encounter: Payer: Self-pay | Admitting: Hematology

## 2020-08-24 ENCOUNTER — Inpatient Hospital Stay: Payer: Medicare Other | Attending: Hematology | Admitting: Hematology

## 2020-08-24 VITALS — BP 157/84 | HR 85 | Temp 98.6°F | Resp 18 | Ht 65.0 in | Wt 184.0 lb

## 2020-08-24 DIAGNOSIS — Z9221 Personal history of antineoplastic chemotherapy: Secondary | ICD-10-CM | POA: Diagnosis not present

## 2020-08-24 DIAGNOSIS — Z85048 Personal history of other malignant neoplasm of rectum, rectosigmoid junction, and anus: Secondary | ICD-10-CM

## 2020-08-24 DIAGNOSIS — Z1231 Encounter for screening mammogram for malignant neoplasm of breast: Secondary | ICD-10-CM | POA: Diagnosis not present

## 2020-08-24 DIAGNOSIS — K219 Gastro-esophageal reflux disease without esophagitis: Secondary | ICD-10-CM | POA: Insufficient documentation

## 2020-08-24 DIAGNOSIS — Z7982 Long term (current) use of aspirin: Secondary | ICD-10-CM | POA: Diagnosis not present

## 2020-08-24 DIAGNOSIS — Z79899 Other long term (current) drug therapy: Secondary | ICD-10-CM | POA: Diagnosis not present

## 2020-08-24 DIAGNOSIS — Z923 Personal history of irradiation: Secondary | ICD-10-CM | POA: Insufficient documentation

## 2020-08-24 DIAGNOSIS — C210 Malignant neoplasm of anus, unspecified: Secondary | ICD-10-CM | POA: Insufficient documentation

## 2020-08-24 DIAGNOSIS — F329 Major depressive disorder, single episode, unspecified: Secondary | ICD-10-CM | POA: Insufficient documentation

## 2020-08-24 LAB — CMP (CANCER CENTER ONLY)
ALT: 39 U/L (ref 0–44)
AST: 26 U/L (ref 15–41)
Albumin: 3.5 g/dL (ref 3.5–5.0)
Alkaline Phosphatase: 126 U/L (ref 38–126)
Anion gap: 6 (ref 5–15)
BUN: 19 mg/dL (ref 8–23)
CO2: 28 mmol/L (ref 22–32)
Calcium: 9.6 mg/dL (ref 8.9–10.3)
Chloride: 107 mmol/L (ref 98–111)
Creatinine: 0.82 mg/dL (ref 0.44–1.00)
GFR, Est AFR Am: >60 mL/min (ref >60)
GFR, Estimated: >60 mL/min (ref >60)
Glucose, Bld: 100 mg/dL — ABNORMAL HIGH (ref 70–99)
Potassium: 4.7 mmol/L (ref 3.5–5.1)
Sodium: 141 mmol/L (ref 135–145)
Total Bilirubin: 0.9 mg/dL (ref 0.3–1.2)
Total Protein: 6.8 g/dL (ref 6.5–8.1)

## 2020-08-24 LAB — CBC WITH DIFFERENTIAL (CANCER CENTER ONLY)
Abs Immature Granulocytes: 0.01 10*3/uL (ref 0.00–0.07)
Basophils Absolute: 0 10*3/uL (ref 0.0–0.1)
Basophils Relative: 1 %
Eosinophils Absolute: 0.4 10*3/uL (ref 0.0–0.5)
Eosinophils Relative: 10 %
HCT: 41.9 % (ref 36.0–46.0)
Hemoglobin: 13.9 g/dL (ref 12.0–15.0)
Immature Granulocytes: 0 %
Lymphocytes Relative: 18 %
Lymphs Abs: 0.7 10*3/uL (ref 0.7–4.0)
MCH: 30.8 pg (ref 26.0–34.0)
MCHC: 33.2 g/dL (ref 30.0–36.0)
MCV: 92.9 fL (ref 80.0–100.0)
Monocytes Absolute: 0.6 10*3/uL (ref 0.1–1.0)
Monocytes Relative: 14 %
Neutro Abs: 2.4 10*3/uL (ref 1.7–7.7)
Neutrophils Relative %: 57 %
Platelet Count: 264 10*3/uL (ref 150–400)
RBC: 4.51 MIL/uL (ref 3.87–5.11)
RDW: 12.9 % (ref 11.5–15.5)
WBC Count: 4.1 10*3/uL (ref 4.0–10.5)
nRBC: 0 % (ref 0.0–0.2)

## 2020-08-24 NOTE — Progress Notes (Signed)
Today's ov note faxed to PCP via epic.

## 2020-08-25 ENCOUNTER — Telehealth: Payer: Self-pay | Admitting: Hematology

## 2020-08-25 NOTE — Telephone Encounter (Signed)
Scheduled per 9/23 los. Mailing pt appt calendar.

## 2020-09-05 DIAGNOSIS — R7303 Prediabetes: Secondary | ICD-10-CM | POA: Diagnosis not present

## 2020-09-09 ENCOUNTER — Ambulatory Visit
Admission: RE | Admit: 2020-09-09 | Discharge: 2020-09-09 | Disposition: A | Discharge status: Home / Self Care | Payer: Medicare Other | Source: Ambulatory Visit | Attending: Physical Medicine and Rehabilitation | Admitting: Physical Medicine and Rehabilitation

## 2020-09-09 DIAGNOSIS — M48061 Spinal stenosis, lumbar region without neurogenic claudication: Secondary | ICD-10-CM | POA: Diagnosis not present

## 2020-09-09 DIAGNOSIS — M47816 Spondylosis without myelopathy or radiculopathy, lumbar region: Secondary | ICD-10-CM | POA: Diagnosis not present

## 2020-09-09 DIAGNOSIS — M545 Low back pain, unspecified: Secondary | ICD-10-CM

## 2020-09-22 DIAGNOSIS — M5416 Radiculopathy, lumbar region: Secondary | ICD-10-CM | POA: Diagnosis not present

## 2020-09-22 DIAGNOSIS — M4180 Other forms of scoliosis, site unspecified: Secondary | ICD-10-CM | POA: Diagnosis not present

## 2020-09-22 DIAGNOSIS — M5136 Other intervertebral disc degeneration, lumbar region: Secondary | ICD-10-CM | POA: Diagnosis not present

## 2020-09-26 DIAGNOSIS — M5136 Other intervertebral disc degeneration, lumbar region: Secondary | ICD-10-CM | POA: Diagnosis not present

## 2020-09-28 DIAGNOSIS — M5136 Other intervertebral disc degeneration, lumbar region: Secondary | ICD-10-CM | POA: Diagnosis not present

## 2020-10-03 DIAGNOSIS — M5136 Other intervertebral disc degeneration, lumbar region: Secondary | ICD-10-CM | POA: Diagnosis not present

## 2020-10-05 DIAGNOSIS — M5136 Other intervertebral disc degeneration, lumbar region: Secondary | ICD-10-CM | POA: Diagnosis not present

## 2020-10-10 DIAGNOSIS — Z20822 Contact with and (suspected) exposure to covid-19: Secondary | ICD-10-CM | POA: Diagnosis not present

## 2020-11-22 DIAGNOSIS — M5136 Other intervertebral disc degeneration, lumbar region: Secondary | ICD-10-CM | POA: Diagnosis not present

## 2020-11-22 DIAGNOSIS — M4180 Other forms of scoliosis, site unspecified: Secondary | ICD-10-CM | POA: Diagnosis not present

## 2020-11-22 DIAGNOSIS — M5416 Radiculopathy, lumbar region: Secondary | ICD-10-CM | POA: Diagnosis not present

## 2020-11-22 DIAGNOSIS — M48062 Spinal stenosis, lumbar region with neurogenic claudication: Secondary | ICD-10-CM | POA: Diagnosis not present

## 2020-11-23 DIAGNOSIS — H9190 Unspecified hearing loss, unspecified ear: Secondary | ICD-10-CM | POA: Diagnosis not present

## 2020-11-23 DIAGNOSIS — Z0000 Encounter for general adult medical examination without abnormal findings: Secondary | ICD-10-CM | POA: Diagnosis not present

## 2020-11-23 DIAGNOSIS — Z23 Encounter for immunization: Secondary | ICD-10-CM | POA: Diagnosis not present

## 2020-12-08 DIAGNOSIS — E7800 Pure hypercholesterolemia, unspecified: Secondary | ICD-10-CM | POA: Diagnosis not present

## 2020-12-08 DIAGNOSIS — Z79899 Other long term (current) drug therapy: Secondary | ICD-10-CM | POA: Diagnosis not present

## 2020-12-21 DIAGNOSIS — M5136 Other intervertebral disc degeneration, lumbar region: Secondary | ICD-10-CM | POA: Diagnosis not present

## 2020-12-26 DIAGNOSIS — M5136 Other intervertebral disc degeneration, lumbar region: Secondary | ICD-10-CM | POA: Diagnosis not present

## 2020-12-28 DIAGNOSIS — M5136 Other intervertebral disc degeneration, lumbar region: Secondary | ICD-10-CM | POA: Diagnosis not present

## 2021-01-01 DIAGNOSIS — K219 Gastro-esophageal reflux disease without esophagitis: Secondary | ICD-10-CM | POA: Diagnosis not present

## 2021-01-01 DIAGNOSIS — F322 Major depressive disorder, single episode, severe without psychotic features: Secondary | ICD-10-CM | POA: Diagnosis not present

## 2021-01-01 DIAGNOSIS — G4700 Insomnia, unspecified: Secondary | ICD-10-CM | POA: Diagnosis not present

## 2021-01-01 DIAGNOSIS — Z85048 Personal history of other malignant neoplasm of rectum, rectosigmoid junction, and anus: Secondary | ICD-10-CM | POA: Diagnosis not present

## 2021-01-04 DIAGNOSIS — M5136 Other intervertebral disc degeneration, lumbar region: Secondary | ICD-10-CM | POA: Diagnosis not present

## 2021-01-08 DIAGNOSIS — R030 Elevated blood-pressure reading, without diagnosis of hypertension: Secondary | ICD-10-CM | POA: Diagnosis not present

## 2021-01-08 DIAGNOSIS — I10 Essential (primary) hypertension: Secondary | ICD-10-CM | POA: Diagnosis not present

## 2021-01-23 ENCOUNTER — Telehealth: Payer: Self-pay | Admitting: Nurse Practitioner

## 2021-01-23 NOTE — Telephone Encounter (Signed)
Attempted to contact patient about moved appointment time. Per provider template. Left a detail message.

## 2021-01-28 DIAGNOSIS — G4700 Insomnia, unspecified: Secondary | ICD-10-CM | POA: Diagnosis not present

## 2021-01-28 DIAGNOSIS — F322 Major depressive disorder, single episode, severe without psychotic features: Secondary | ICD-10-CM | POA: Diagnosis not present

## 2021-01-28 DIAGNOSIS — K219 Gastro-esophageal reflux disease without esophagitis: Secondary | ICD-10-CM | POA: Diagnosis not present

## 2021-01-28 DIAGNOSIS — Z85048 Personal history of other malignant neoplasm of rectum, rectosigmoid junction, and anus: Secondary | ICD-10-CM | POA: Diagnosis not present

## 2021-02-02 DIAGNOSIS — I10 Essential (primary) hypertension: Secondary | ICD-10-CM | POA: Diagnosis not present

## 2021-02-14 DIAGNOSIS — G894 Chronic pain syndrome: Secondary | ICD-10-CM | POA: Diagnosis not present

## 2021-02-14 DIAGNOSIS — M5416 Radiculopathy, lumbar region: Secondary | ICD-10-CM | POA: Diagnosis not present

## 2021-02-27 DIAGNOSIS — M5416 Radiculopathy, lumbar region: Secondary | ICD-10-CM | POA: Diagnosis not present

## 2021-02-27 DIAGNOSIS — M5459 Other low back pain: Secondary | ICD-10-CM | POA: Diagnosis not present

## 2021-02-27 DIAGNOSIS — R262 Difficulty in walking, not elsewhere classified: Secondary | ICD-10-CM | POA: Diagnosis not present

## 2021-02-27 DIAGNOSIS — M6281 Muscle weakness (generalized): Secondary | ICD-10-CM | POA: Diagnosis not present

## 2021-03-07 DIAGNOSIS — G8929 Other chronic pain: Secondary | ICD-10-CM | POA: Diagnosis not present

## 2021-03-07 DIAGNOSIS — R300 Dysuria: Secondary | ICD-10-CM | POA: Diagnosis not present

## 2021-03-07 DIAGNOSIS — M25512 Pain in left shoulder: Secondary | ICD-10-CM | POA: Diagnosis not present

## 2021-03-07 DIAGNOSIS — M7022 Olecranon bursitis, left elbow: Secondary | ICD-10-CM | POA: Diagnosis not present

## 2021-03-08 DIAGNOSIS — M5416 Radiculopathy, lumbar region: Secondary | ICD-10-CM | POA: Diagnosis not present

## 2021-03-08 DIAGNOSIS — R262 Difficulty in walking, not elsewhere classified: Secondary | ICD-10-CM | POA: Diagnosis not present

## 2021-03-08 DIAGNOSIS — M5459 Other low back pain: Secondary | ICD-10-CM | POA: Diagnosis not present

## 2021-03-08 DIAGNOSIS — M6281 Muscle weakness (generalized): Secondary | ICD-10-CM | POA: Diagnosis not present

## 2021-03-13 DIAGNOSIS — M5416 Radiculopathy, lumbar region: Secondary | ICD-10-CM | POA: Diagnosis not present

## 2021-03-13 DIAGNOSIS — M5459 Other low back pain: Secondary | ICD-10-CM | POA: Diagnosis not present

## 2021-03-13 DIAGNOSIS — R262 Difficulty in walking, not elsewhere classified: Secondary | ICD-10-CM | POA: Diagnosis not present

## 2021-03-13 DIAGNOSIS — M6281 Muscle weakness (generalized): Secondary | ICD-10-CM | POA: Diagnosis not present

## 2021-03-19 DIAGNOSIS — G4700 Insomnia, unspecified: Secondary | ICD-10-CM | POA: Diagnosis not present

## 2021-03-19 DIAGNOSIS — M5416 Radiculopathy, lumbar region: Secondary | ICD-10-CM | POA: Diagnosis not present

## 2021-03-19 DIAGNOSIS — R262 Difficulty in walking, not elsewhere classified: Secondary | ICD-10-CM | POA: Diagnosis not present

## 2021-03-19 DIAGNOSIS — M5459 Other low back pain: Secondary | ICD-10-CM | POA: Diagnosis not present

## 2021-03-19 DIAGNOSIS — M6281 Muscle weakness (generalized): Secondary | ICD-10-CM | POA: Diagnosis not present

## 2021-03-19 DIAGNOSIS — Z85048 Personal history of other malignant neoplasm of rectum, rectosigmoid junction, and anus: Secondary | ICD-10-CM | POA: Diagnosis not present

## 2021-03-19 DIAGNOSIS — K219 Gastro-esophageal reflux disease without esophagitis: Secondary | ICD-10-CM | POA: Diagnosis not present

## 2021-03-19 DIAGNOSIS — F322 Major depressive disorder, single episode, severe without psychotic features: Secondary | ICD-10-CM | POA: Diagnosis not present

## 2021-03-22 ENCOUNTER — Emergency Department (HOSPITAL_BASED_OUTPATIENT_CLINIC_OR_DEPARTMENT_OTHER): Payer: Medicare Other

## 2021-03-22 ENCOUNTER — Encounter (HOSPITAL_BASED_OUTPATIENT_CLINIC_OR_DEPARTMENT_OTHER): Payer: Self-pay

## 2021-03-22 ENCOUNTER — Emergency Department (HOSPITAL_BASED_OUTPATIENT_CLINIC_OR_DEPARTMENT_OTHER)
Admission: EM | Admit: 2021-03-22 | Discharge: 2021-03-22 | Disposition: A | Discharge status: Home / Self Care | Payer: Medicare Other | Attending: Emergency Medicine | Admitting: Emergency Medicine

## 2021-03-22 ENCOUNTER — Other Ambulatory Visit: Payer: Self-pay

## 2021-03-22 DIAGNOSIS — Z85048 Personal history of other malignant neoplasm of rectum, rectosigmoid junction, and anus: Secondary | ICD-10-CM | POA: Diagnosis not present

## 2021-03-22 DIAGNOSIS — R198 Other specified symptoms and signs involving the digestive system and abdomen: Secondary | ICD-10-CM | POA: Insufficient documentation

## 2021-03-22 DIAGNOSIS — R1011 Right upper quadrant pain: Secondary | ICD-10-CM | POA: Diagnosis present

## 2021-03-22 DIAGNOSIS — Z7982 Long term (current) use of aspirin: Secondary | ICD-10-CM | POA: Insufficient documentation

## 2021-03-22 DIAGNOSIS — K8020 Calculus of gallbladder without cholecystitis without obstruction: Secondary | ICD-10-CM | POA: Diagnosis not present

## 2021-03-22 DIAGNOSIS — I10 Essential (primary) hypertension: Secondary | ICD-10-CM | POA: Diagnosis not present

## 2021-03-22 HISTORY — DX: Essential (primary) hypertension: I10

## 2021-03-22 HISTORY — DX: Spinal stenosis, site unspecified: M48.00

## 2021-03-22 LAB — COMPREHENSIVE METABOLIC PANEL
ALT: 33 U/L (ref 0–44)
AST: 31 U/L (ref 15–41)
Albumin: 3.7 g/dL (ref 3.5–5.0)
Alkaline Phosphatase: 100 U/L (ref 38–126)
Anion gap: 9 (ref 5–15)
BUN: 18 mg/dL (ref 8–23)
CO2: 22 mmol/L (ref 22–32)
Calcium: 9.2 mg/dL (ref 8.9–10.3)
Chloride: 105 mmol/L (ref 98–111)
Creatinine, Ser: 0.71 mg/dL (ref 0.44–1.00)
GFR, Estimated: >60 mL/min (ref >60)
Glucose, Bld: 100 mg/dL — ABNORMAL HIGH (ref 70–99)
Potassium: 3.8 mmol/L (ref 3.5–5.1)
Sodium: 136 mmol/L (ref 135–145)
Total Bilirubin: 0.6 mg/dL (ref 0.3–1.2)
Total Protein: 6.9 g/dL (ref 6.5–8.1)

## 2021-03-22 LAB — CBC WITH DIFFERENTIAL/PLATELET
Abs Immature Granulocytes: 0.01 10*3/uL (ref 0.00–0.07)
Basophils Absolute: 0.1 10*3/uL (ref 0.0–0.1)
Basophils Relative: 1 %
Eosinophils Absolute: 0.2 10*3/uL (ref 0.0–0.5)
Eosinophils Relative: 5 %
HCT: 40.8 % (ref 36.0–46.0)
Hemoglobin: 13.8 g/dL (ref 12.0–15.0)
Immature Granulocytes: 0 %
Lymphocytes Relative: 20 %
Lymphs Abs: 1 10*3/uL (ref 0.7–4.0)
MCH: 31.8 pg (ref 26.0–34.0)
MCHC: 33.8 g/dL (ref 30.0–36.0)
MCV: 94 fL (ref 80.0–100.0)
Monocytes Absolute: 0.5 10*3/uL (ref 0.1–1.0)
Monocytes Relative: 10 %
Neutro Abs: 3.3 10*3/uL (ref 1.7–7.7)
Neutrophils Relative %: 64 %
Platelets: 309 10*3/uL (ref 150–400)
RBC: 4.34 MIL/uL (ref 3.87–5.11)
RDW: 12.6 % (ref 11.5–15.5)
WBC: 5.1 10*3/uL (ref 4.0–10.5)
nRBC: 0 % (ref 0.0–0.2)

## 2021-03-22 LAB — URINALYSIS, ROUTINE W REFLEX MICROSCOPIC
Bilirubin Urine: NEGATIVE
Glucose, UA: NEGATIVE mg/dL
Hgb urine dipstick: NEGATIVE
Ketones, ur: NEGATIVE mg/dL
Nitrite: NEGATIVE
Protein, ur: NEGATIVE mg/dL
Specific Gravity, Urine: <1.005 — ABNORMAL LOW (ref 1.005–1.030)
pH: 7 (ref 5.0–8.0)

## 2021-03-22 LAB — URINALYSIS, MICROSCOPIC (REFLEX)

## 2021-03-22 LAB — LIPASE, BLOOD: Lipase: 37 U/L (ref 11–51)

## 2021-03-22 MED ORDER — MORPHINE SULFATE (PF) 2 MG/ML IV SOLN
2.0000 mg | Freq: Once | INTRAVENOUS | Status: AC
  Administered 2021-03-22: 2 mg via INTRAVENOUS
  Filled 2021-03-22: qty 1

## 2021-03-22 MED ORDER — ONDANSETRON HCL 4 MG/2ML IJ SOLN
4.0000 mg | Freq: Once | INTRAMUSCULAR | Status: AC
  Administered 2021-03-22: 4 mg via INTRAVENOUS
  Filled 2021-03-22: qty 2

## 2021-03-22 NOTE — ED Triage Notes (Addendum)
Pt c/o RUQ/flank pain x 2 hours-nausea-NAD-steady gait-pt later added that she is on 2nd round of abx for "bladder infection" and that she has had some SOB

## 2021-03-22 NOTE — ED Provider Notes (Signed)
[place] EMERGENCY DEPARTMENT Provider Note   CSN: [id number] Arrival date & time: 03/22/21  1308     History Chief Complaint  Patient presents with  . Abdominal Pain    Penny Guzman is a 70 y.o. female.  HPI 70 year old female with a history of colon cancer, depression, GERD, hypertension presents to the ER with complaints of sudden onset of right upper quadrant pain several hours ago.  Patient states that she was laying in bed when she felt a sudden onset of right upper quadrant pain with some associated nausea.  She also endorses some radiation to the back.  No chest pain, shortness of breath.  She does still have her gallbladder.  She reports finishing up a second round of antibiotics for UTI, though denies any dysuria, hematuria, no flank pain.  He has not taken anything for pain.  No vomiting.  Last bowel movement was yesterday and normal.    Past Medical History:  Diagnosis Date  . Colon cancer ([place]) 03/2016   anal cancer  . Depression   . GERD (gastroesophageal reflux disease)   . Hypertension   . Spinal stenosis     Patient Active Problem List   Diagnosis Date Noted  . Anal cancer ([place]) 02/22/2020    Past Surgical History:  Procedure Laterality Date  . ovarian cysts       OB History   No obstetric history on file.     Family History  Problem Relation Age of Onset  . Cancer Mother        skin  . Cancer Father        bladder  . Cancer Daughter        AML, deceased age 34    Social History   Tobacco Use  . Smoking status: Never Smoker  . Smokeless tobacco: Never Used  Substance Use Topics  . Alcohol use: Yes    Comment: occ  . Drug use: Never    Home Medications Prior to Admission medications   Medication Sig Start Date End Date Taking? Authorizing Provider  aspirin 81 MG chewable tablet Chew by mouth daily.    [provider]  atorvastatin (LIPITOR) 20 MG tablet Take 20 mg by mouth daily. 12/20/18 05/04/21  [provider]  augmented betamethasone dipropionate (DIPROLENE-AF) 0.05 % cream Apply 30 g topically as needed. 02/02/20   [provider]  b complex vitamins tablet Take 1 tablet by mouth daily.    [provider]  calcium-vitamin D (OSCAL WITH D) 500-200 MG-UNIT tablet Take 1 tablet by mouth.    [provider]  co-enzyme Q-10 30 MG capsule Take 30 mg by mouth 3 (three) times daily.    [provider]  diazepam (VALIUM) 5 MG tablet Take by mouth. 07/24/20   [provider]  FLUoxetine (PROZAC) 10 MG capsule Take 10 mg by mouth daily. 02/06/20   [provider]  gabapentin (NEURONTIN) 100 MG capsule Take 200 mg by mouth 2 (two) times daily. 08/13/20   [provider]  ibuprofen (ADVIL) 200 MG tablet Take 200 mg by mouth daily. 250 mg daily    [provider]  omeprazole (PRILOSEC) 20 MG capsule Take 20 mg by mouth daily. 03/02/18 03/01/20  [provider]  temazepam (RESTORIL) 15 MG capsule Take 15 mg by mouth at bedtime as needed. 11/08/19   [provider]    Allergies    Codeine, Propoxyphene, and Triamcinolone  Review of Systems  Review of Systems  Constitutional: Negative for chills and fever.  HENT: Negative for ear pain and sore throat.   Eyes: Negative for pain and visual disturbance.  Respiratory: Negative for cough and shortness of breath.   Cardiovascular: Negative for chest pain and palpitations.  Gastrointestinal: Positive for abdominal pain and nausea. Negative for vomiting.  Genitourinary: Negative for dysuria and hematuria.  Musculoskeletal: Negative for arthralgias and back pain.  Skin: Negative for color change and rash.  Neurological: Negative for seizures and syncope.  All other systems reviewed and are negative.   Physical Exam Updated Vital Signs BP (!) 153/95 Comment: Simultaneous filing. User may not have seen previous data.  Pulse 69 Comment: Simultaneous filing. User may not  have seen previous data.  Temp 98.2 F (36.8 C) (Oral)   Resp 18 Comment: Simultaneous filing. User may not have seen previous data.  Ht 5\' 5"  (1.651 m)   Wt 78 kg   SpO2 98% Comment: Simultaneous filing. User may not have seen previous data.  BMI 28.62 kg/m   Physical Exam Vitals and nursing note reviewed.  Constitutional:      General: She is not in acute distress.    Appearance: She is well-developed.  HENT:     Head: Normocephalic and atraumatic.  Eyes:     Conjunctiva/sclera: Conjunctivae normal.  Cardiovascular:     Rate and Rhythm: Normal rate and regular rhythm.     Heart sounds: No murmur heard.   Pulmonary:     Effort: Pulmonary effort is normal. No respiratory distress.     Breath sounds: Normal breath sounds.  Abdominal:     Palpations: Abdomen is soft.     Tenderness: There is abdominal tenderness in the right upper quadrant. Positive signs include Murphy's sign.     Hernia: No hernia is present.  Musculoskeletal:     Cervical back: Neck supple.  Skin:    General: Skin is warm and dry.  Neurological:     General: No focal deficit present.     Mental Status: She is alert.  Psychiatric:        Mood and Affect: Mood normal.        Behavior: Behavior normal.     ED Results / Procedures / Treatments   Labs (all labs ordered are listed, but only abnormal results are displayed) Labs Reviewed  COMPREHENSIVE METABOLIC PANEL - Abnormal; Notable for the following components:      Result Value   Glucose, Bld 100 (*)    All other components within normal limits  URINALYSIS, ROUTINE W REFLEX MICROSCOPIC - Abnormal; Notable for the following components:   Specific Gravity, Urine <1.005 (*)    Leukocytes,Ua TRACE (*)    All other components within normal limits  URINALYSIS, MICROSCOPIC (REFLEX) - Abnormal; Notable for the following components:   Bacteria, UA RARE (*)    All other components within normal limits  CBC WITH DIFFERENTIAL/PLATELET  LIPASE, BLOOD     EKG EKG Interpretation  Date/Time:  Thursday March 22 2021 13:50:57 EDT Ventricular Rate:  70 PR Interval:  148 QRS Duration: 98 QT Interval:  455 QTC Calculation: 491 R Axis:   -54 Text Interpretation: Sinus rhythm Left anterior fascicular block Borderline low voltage, extremity leads Borderline prolonged QT interval Confirmed by Lennice Sites ([id number]) on 03/22/2021 1:54:22 PM   Radiology US Abdomen Limited RUQ (LIVER/GB)  Result Date: 03/22/2021 CLINICAL DATA:  Right upper quadrant pain with reported positive Murphy's sign prior to pain meds. EXAM: ULTRASOUND  ABDOMEN LIMITED RIGHT UPPER QUADRANT COMPARISON:  CT March 31, 2020. FINDINGS: Gallbladder: Cholelithiasis measuring up to 1.1 cm. No pericholecystic fluid or wall thickening visualized. No sonographic Murphy sign noted by sonographer. Common bile duct: Diameter: 4 mm Liver: No focal lesion identified. Within normal limits in parenchymal echogenicity. Portal vein is patent on color Doppler imaging with normal direction of blood flow towards the liver. Other: None. IMPRESSION: Cholelithiasis. No sonographic evidence of acute cholecystitis. If continued clinical concern for cystic duct patency recommend further evaluation with nuclear medicine HIDA scan. Electronically Signed   By: Dahlia Bailiff MD   On: 03/22/2021 14:51    Procedures Procedures   Medications Ordered in ED Medications  morphine 2 MG/ML injection 2 mg (2 mg Intravenous Given 03/22/21 1405)  ondansetron (ZOFRAN) injection 4 mg (4 mg Intravenous Given 03/22/21 1404)    ED Course  I have reviewed the triage vital signs and the nursing notes.  Pertinent labs & imaging results that were available during my care of the patient were reviewed by me and considered in my medical decision making (see chart for details).    MDM Rules/Calculators/A&P                          70 year old female with right upper quadrant pain.  On arrival, vitals reassuring, afebrile, not  tachycardic, tachypneic hypoxic.  Initially hypertensive, however this improved throughout the ED course.  Physical exam with positive Murphy sign, right upper quadrant tenderness.  No left lower or right lower quadrant tenderness.  No CVA tenderness.  Labs and imaging overall reassuring.  CBC without leukocytosis, CMP largely unremarkable.  UA without evidence of UTI.  Lipase is normal.  Her EKG is normal sinus rhythm.  Right upper quadrant ultrasound with cholelithiasis without evidence of cholecystitis.  Patient was given morphine here in the ER, and upon reevaluation notes complete resolution of her symptoms.    I suspect her symptoms are secondary to symptomatic cholelithiasis.  Low suspicion for choledocholithiasis at this time.  Patient will be referred to general surgery, she will be given a handout and information on what foods to avoid to not exacerbate her symptoms.  We discussed return precautions.  She voiced understanding and is agreeable.  Stable for discharge. Final Clinical Impression(s) / ED Diagnoses Final diagnoses:  Positive Murphy's Sign  Symptomatic cholelithiasis    Rx / DC Orders ED Discharge Orders    None       Garald Balding, PA-C 03/22/21 [place], [place], DO 03/23/21 [phone]

## 2021-03-22 NOTE — ED Notes (Signed)
Pt ambulated to the bathroom steady gait

## 2021-03-22 NOTE — Discharge Instructions (Signed)
Your work-up today shows that you have gallstones in your gallbladder however they are not inflamed or infected.  Please avoid high fatty foods, alcohol as this will exacerbate your symptoms.  Please follow-up with general surgery if you continue to have issues for possible surgical removal of her gallbladder.  Please return to the ER for any new or worsening symptoms.

## 2021-04-02 DIAGNOSIS — M4180 Other forms of scoliosis, site unspecified: Secondary | ICD-10-CM | POA: Diagnosis not present

## 2021-04-02 DIAGNOSIS — M5136 Other intervertebral disc degeneration, lumbar region: Secondary | ICD-10-CM | POA: Diagnosis not present

## 2021-04-04 DIAGNOSIS — M5459 Other low back pain: Secondary | ICD-10-CM | POA: Diagnosis not present

## 2021-04-04 DIAGNOSIS — R262 Difficulty in walking, not elsewhere classified: Secondary | ICD-10-CM | POA: Diagnosis not present

## 2021-04-04 DIAGNOSIS — M5416 Radiculopathy, lumbar region: Secondary | ICD-10-CM | POA: Diagnosis not present

## 2021-04-04 DIAGNOSIS — M6281 Muscle weakness (generalized): Secondary | ICD-10-CM | POA: Diagnosis not present

## 2021-04-11 DIAGNOSIS — R262 Difficulty in walking, not elsewhere classified: Secondary | ICD-10-CM | POA: Diagnosis not present

## 2021-04-11 DIAGNOSIS — M5416 Radiculopathy, lumbar region: Secondary | ICD-10-CM | POA: Diagnosis not present

## 2021-04-11 DIAGNOSIS — M6281 Muscle weakness (generalized): Secondary | ICD-10-CM | POA: Diagnosis not present

## 2021-04-11 DIAGNOSIS — M5459 Other low back pain: Secondary | ICD-10-CM | POA: Diagnosis not present

## 2021-04-18 DIAGNOSIS — M6281 Muscle weakness (generalized): Secondary | ICD-10-CM | POA: Diagnosis not present

## 2021-04-18 DIAGNOSIS — M5416 Radiculopathy, lumbar region: Secondary | ICD-10-CM | POA: Diagnosis not present

## 2021-04-18 DIAGNOSIS — M5459 Other low back pain: Secondary | ICD-10-CM | POA: Diagnosis not present

## 2021-04-18 DIAGNOSIS — R262 Difficulty in walking, not elsewhere classified: Secondary | ICD-10-CM | POA: Diagnosis not present

## 2021-05-02 DIAGNOSIS — M25512 Pain in left shoulder: Secondary | ICD-10-CM | POA: Diagnosis not present

## 2021-05-02 DIAGNOSIS — R200 Anesthesia of skin: Secondary | ICD-10-CM | POA: Diagnosis not present

## 2021-05-16 DIAGNOSIS — M5416 Radiculopathy, lumbar region: Secondary | ICD-10-CM | POA: Diagnosis not present

## 2021-05-16 DIAGNOSIS — R262 Difficulty in walking, not elsewhere classified: Secondary | ICD-10-CM | POA: Diagnosis not present

## 2021-05-16 DIAGNOSIS — M5459 Other low back pain: Secondary | ICD-10-CM | POA: Diagnosis not present

## 2021-05-16 DIAGNOSIS — M6281 Muscle weakness (generalized): Secondary | ICD-10-CM | POA: Diagnosis not present

## 2021-05-16 IMAGING — CT CT ABD-PELV W/ CM
2 of 5 series · 13 of 36 positions shown, 16 images · IV contrast (OMNIPAQUE)
Comparison: None available

CLINICAL DATA: Anal carcinoma surveillance. Diagnosis 1226 with
chemo radiation therapy completed.

EXAM:
CT CHEST, ABDOMEN, AND PELVIS WITH CONTRAST
TECHNIQUE: Multidetector CT imaging of the chest, abdomen and pelvis was
performed following the standard protocol during bolus
administration of intravenous contrast.
CONTRAST:  100mL OMNIPAQUE IOHEXOL 300 MG/ML  SOLN

[Series 2: cap with · axial · 0.78mm/px · z∈[-636,-96]mm · 10 of 133 slices shown, 13 images]
[im 13/133  mediastinal]
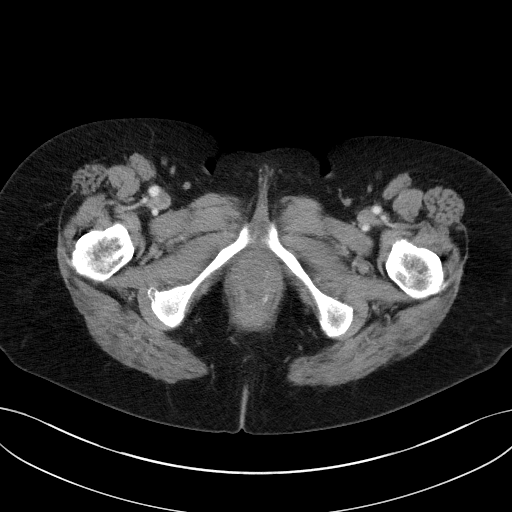
[im 13/133  lung]
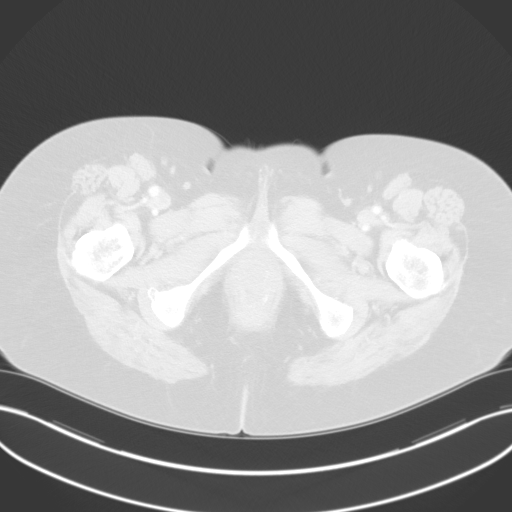
[im 25/133  lung]
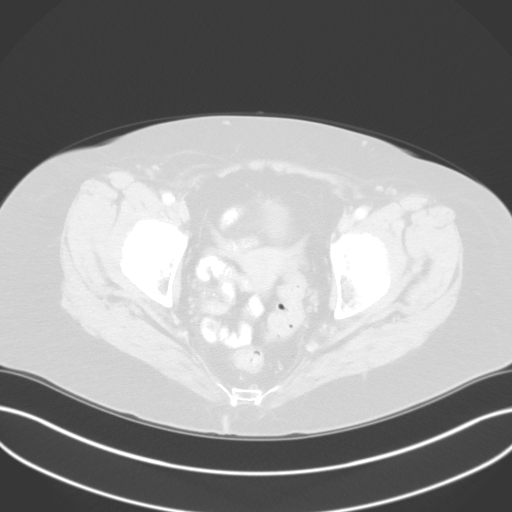
[im 37/133  lung]
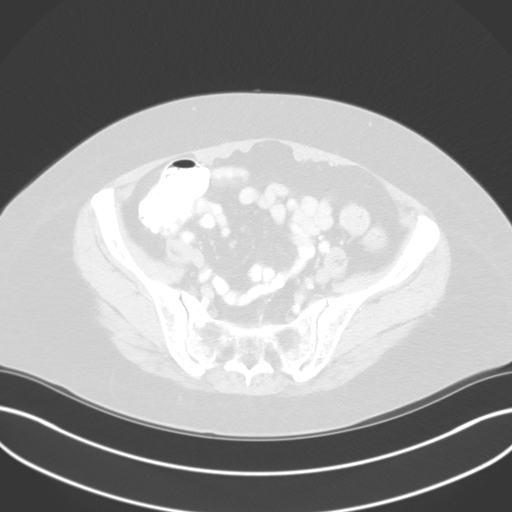
[im 49/133  lung]
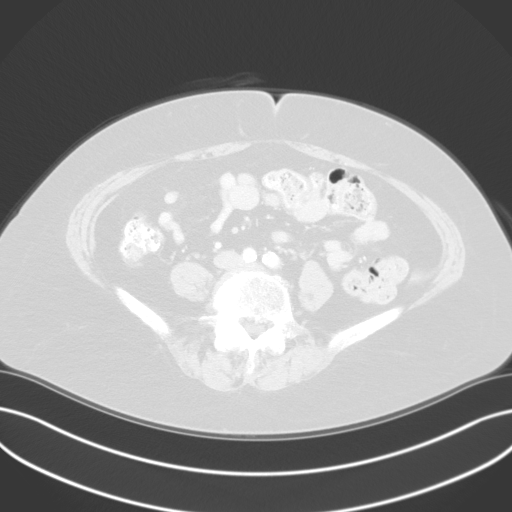
[im 61/133  mediastinal]
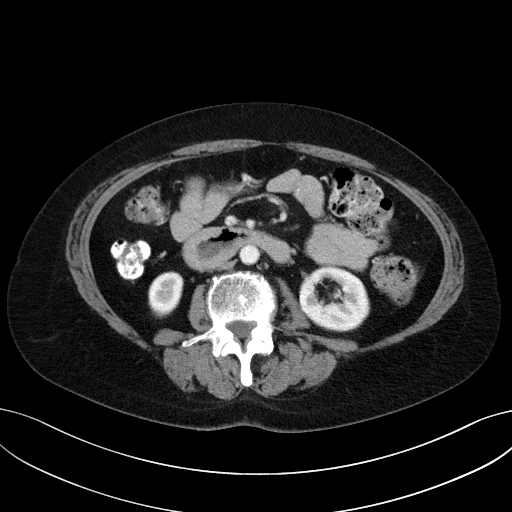
[im 61/133  lung]
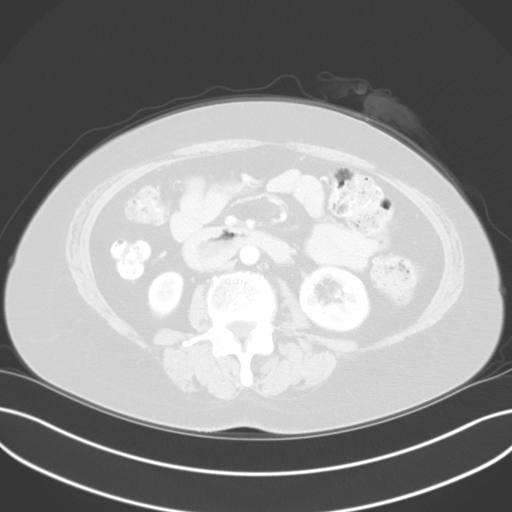
[im 73/133  lung]
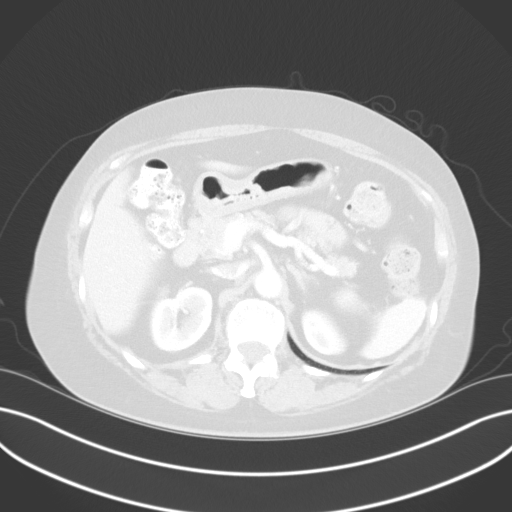
[im 85/133  lung]
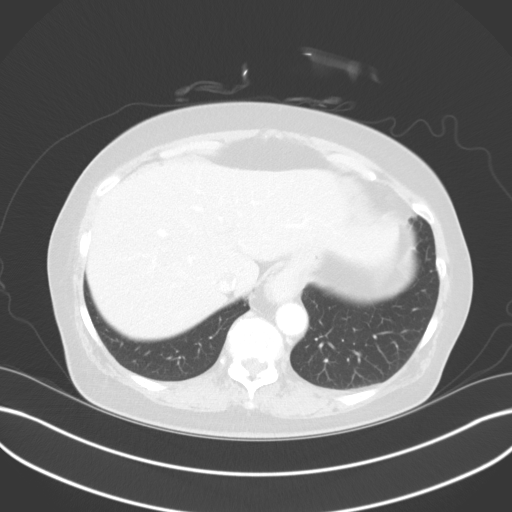
[im 97/133  lung]
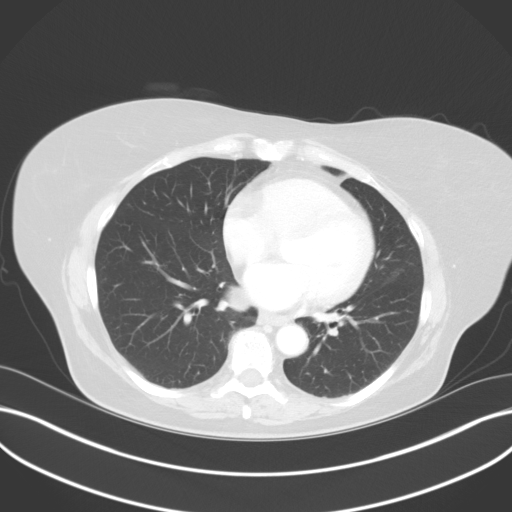
[im 109/133  mediastinal]
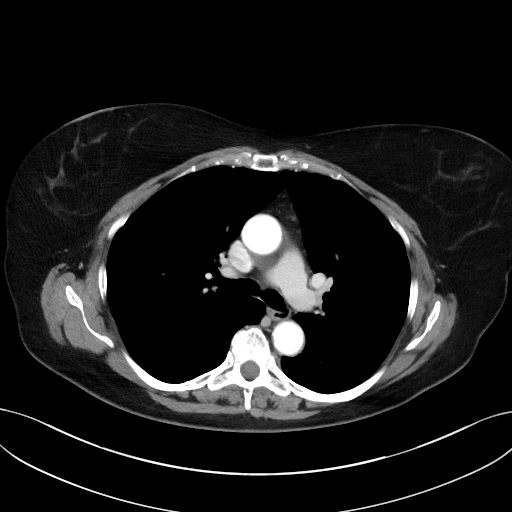
[im 109/133  lung]
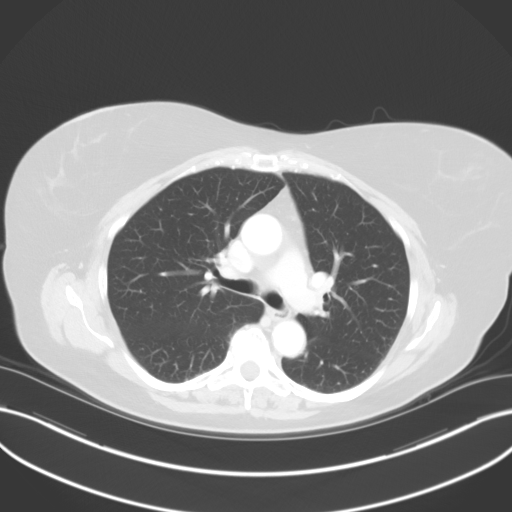
[im 121/133  lung]
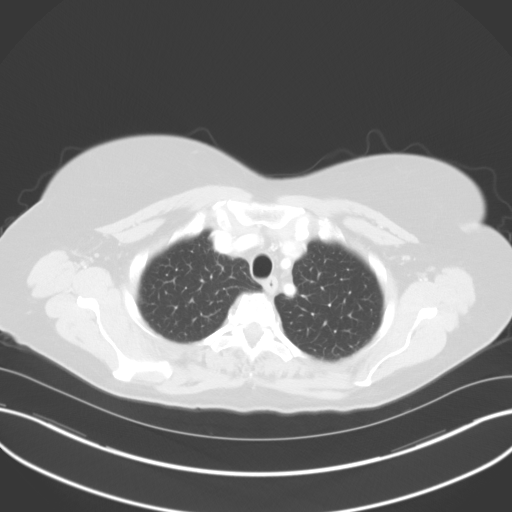

[Series 5: coronals · coronal · 0.78mm/px · 3 of 152 slices shown]
[im 31/152  lung]
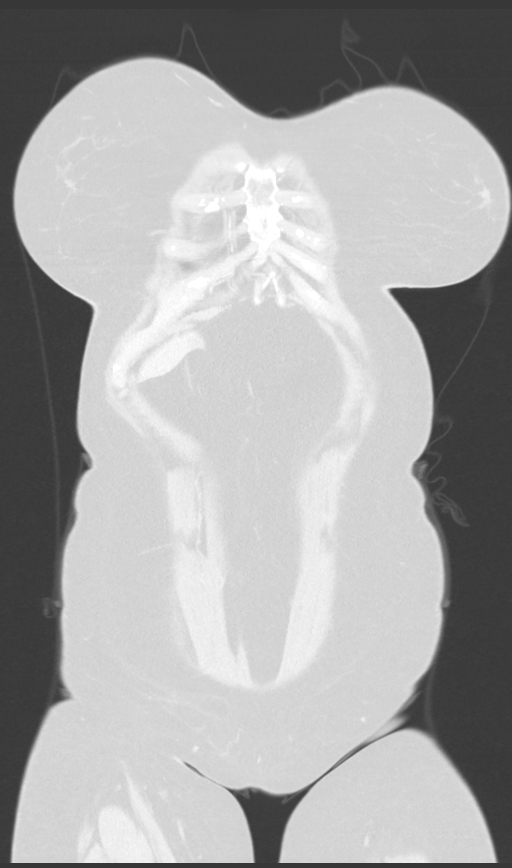
[im 61/152  lung]
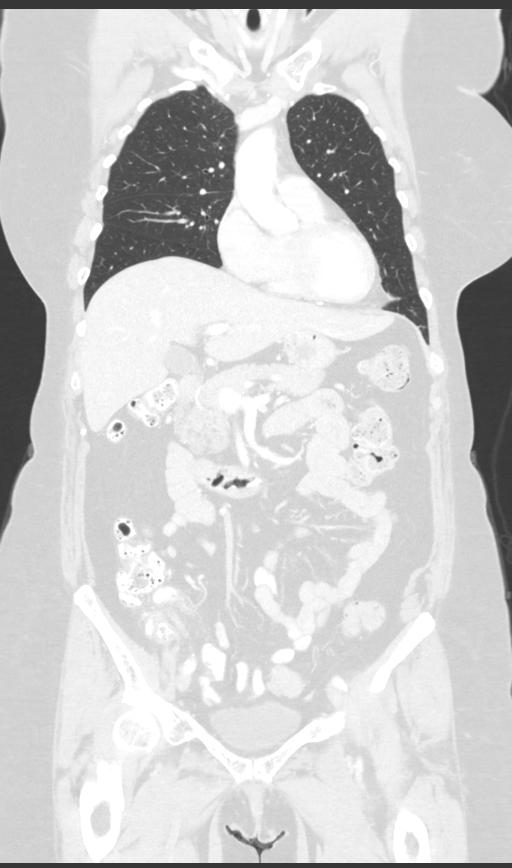
[im 91/152  lung]
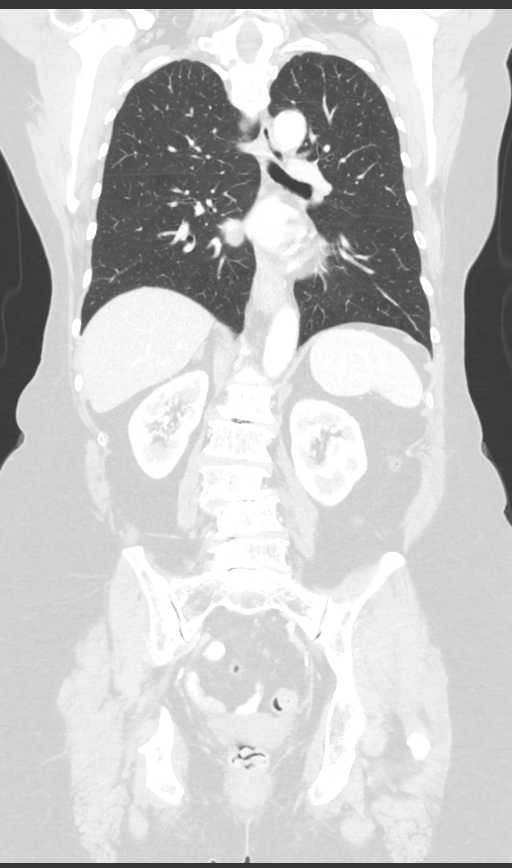

[13 of 36 positions shown; findings below may reference images not displayed]

FINDINGS: CT CHEST FINDINGS

Cardiovascular: Coronary artery calcification and aortic
atherosclerotic calcification.

Mediastinum/Nodes: No axillary or supraclavicular adenopathy. No
mediastinal or hilar adenopathy. No pericardial effusion. Small
hiatal hernia.

Lungs/Pleura: No suspicious pulmonary nodularity.  Airways normal.

Musculoskeletal: No aggressive osseous lesion.

CT ABDOMEN AND PELVIS FINDINGS

Hepatobiliary: No focal hepatic lesion.  Several gallstones noted.

Pancreas: Pancreas is normal. No ductal dilatation. No pancreatic
inflammation.

Spleen: Normal spleen

Adrenals/urinary tract: Adrenal glands and kidneys are normal. The
ureters and bladder normal.

Stomach/Bowel: Stomach, small-bowel, cecum normal. Appendix not
identified. Ascending, transverse and descending colon normal.
Rectosigmoid colon normal. No mesenteric adenopathy. No perirectal
adenopathy.

Vascular/Lymphatic: Abdominal aorta is normal caliber with
atherosclerotic calcification. There is no retroperitoneal or
periportal lymphadenopathy. No pelvic lymphadenopathy. No inguinal
adenopathy.

Reproductive: Uterus normal.  Adnexa unremarkable.

Other: No free fluid.

Musculoskeletal: No aggressive osseous lesion. Degenerative spurring
of the lumbar spine. Endplate sclerosis.
IMPRESSION: Chest Impression:

1. No evidence of pulmonary metastasis.
2. Coronary artery calcification and Aortic Atherosclerosis
([id number].A).

Abdomen / Pelvis Impression:

1. No evidence local anal carcinoma recurrence.
2. No metastatic adenopathy or visceral metastasis.
3. Cholelithiasis without evidence cholecystitis.
4.  Aortic Atherosclerosis ([id number].A).

## 2021-05-24 DIAGNOSIS — M5416 Radiculopathy, lumbar region: Secondary | ICD-10-CM | POA: Diagnosis not present

## 2021-05-25 DIAGNOSIS — M65839 Other synovitis and tenosynovitis, unspecified forearm: Secondary | ICD-10-CM | POA: Diagnosis not present

## 2021-05-25 DIAGNOSIS — M65832 Other synovitis and tenosynovitis, left forearm: Secondary | ICD-10-CM | POA: Diagnosis not present

## 2021-05-25 DIAGNOSIS — M25532 Pain in left wrist: Secondary | ICD-10-CM | POA: Diagnosis not present

## 2021-05-25 DIAGNOSIS — G5602 Carpal tunnel syndrome, left upper limb: Secondary | ICD-10-CM | POA: Diagnosis not present

## 2021-05-30 DIAGNOSIS — M6281 Muscle weakness (generalized): Secondary | ICD-10-CM | POA: Diagnosis not present

## 2021-05-30 DIAGNOSIS — M5416 Radiculopathy, lumbar region: Secondary | ICD-10-CM | POA: Diagnosis not present

## 2021-05-30 DIAGNOSIS — M5459 Other low back pain: Secondary | ICD-10-CM | POA: Diagnosis not present

## 2021-05-30 DIAGNOSIS — R262 Difficulty in walking, not elsewhere classified: Secondary | ICD-10-CM | POA: Diagnosis not present

## 2021-06-06 ENCOUNTER — Telehealth: Payer: Self-pay | Admitting: Nurse Practitioner

## 2021-06-06 DIAGNOSIS — M6281 Muscle weakness (generalized): Secondary | ICD-10-CM | POA: Diagnosis not present

## 2021-06-06 DIAGNOSIS — M5416 Radiculopathy, lumbar region: Secondary | ICD-10-CM | POA: Diagnosis not present

## 2021-06-06 DIAGNOSIS — R262 Difficulty in walking, not elsewhere classified: Secondary | ICD-10-CM | POA: Diagnosis not present

## 2021-06-06 DIAGNOSIS — M5459 Other low back pain: Secondary | ICD-10-CM | POA: Diagnosis not present

## 2021-06-06 NOTE — Telephone Encounter (Signed)
R/s appts per 7/6 sch msg. Called pt, no answer. Left msg with updated appts times.

## 2021-06-07 DIAGNOSIS — M5412 Radiculopathy, cervical region: Secondary | ICD-10-CM | POA: Diagnosis not present

## 2021-06-13 DIAGNOSIS — R262 Difficulty in walking, not elsewhere classified: Secondary | ICD-10-CM | POA: Diagnosis not present

## 2021-06-13 DIAGNOSIS — M6281 Muscle weakness (generalized): Secondary | ICD-10-CM | POA: Diagnosis not present

## 2021-06-13 DIAGNOSIS — M5459 Other low back pain: Secondary | ICD-10-CM | POA: Diagnosis not present

## 2021-06-13 DIAGNOSIS — M5416 Radiculopathy, lumbar region: Secondary | ICD-10-CM | POA: Diagnosis not present

## 2021-06-15 DIAGNOSIS — M4722 Other spondylosis with radiculopathy, cervical region: Secondary | ICD-10-CM | POA: Diagnosis not present

## 2021-06-15 DIAGNOSIS — R030 Elevated blood-pressure reading, without diagnosis of hypertension: Secondary | ICD-10-CM | POA: Diagnosis not present

## 2021-06-15 DIAGNOSIS — I700 Atherosclerosis of aorta: Secondary | ICD-10-CM | POA: Diagnosis not present

## 2021-06-15 DIAGNOSIS — R9389 Abnormal findings on diagnostic imaging of other specified body structures: Secondary | ICD-10-CM | POA: Diagnosis not present

## 2021-06-15 DIAGNOSIS — R5383 Other fatigue: Secondary | ICD-10-CM | POA: Diagnosis not present

## 2021-06-18 DIAGNOSIS — R300 Dysuria: Secondary | ICD-10-CM | POA: Diagnosis not present

## 2021-06-20 ENCOUNTER — Other Ambulatory Visit (HOSPITAL_BASED_OUTPATIENT_CLINIC_OR_DEPARTMENT_OTHER): Payer: Self-pay | Admitting: Family Medicine

## 2021-06-20 DIAGNOSIS — R9389 Abnormal findings on diagnostic imaging of other specified body structures: Secondary | ICD-10-CM

## 2021-06-27 DIAGNOSIS — M5416 Radiculopathy, lumbar region: Secondary | ICD-10-CM | POA: Diagnosis not present

## 2021-06-27 DIAGNOSIS — M6281 Muscle weakness (generalized): Secondary | ICD-10-CM | POA: Diagnosis not present

## 2021-06-27 DIAGNOSIS — M5459 Other low back pain: Secondary | ICD-10-CM | POA: Diagnosis not present

## 2021-06-27 DIAGNOSIS — R262 Difficulty in walking, not elsewhere classified: Secondary | ICD-10-CM | POA: Diagnosis not present

## 2021-07-06 DIAGNOSIS — M25522 Pain in left elbow: Secondary | ICD-10-CM | POA: Diagnosis not present

## 2021-07-06 DIAGNOSIS — G5602 Carpal tunnel syndrome, left upper limb: Secondary | ICD-10-CM | POA: Diagnosis not present

## 2021-07-24 DIAGNOSIS — K219 Gastro-esophageal reflux disease without esophagitis: Secondary | ICD-10-CM | POA: Diagnosis not present

## 2021-07-24 DIAGNOSIS — G4700 Insomnia, unspecified: Secondary | ICD-10-CM | POA: Diagnosis not present

## 2021-07-24 DIAGNOSIS — F322 Major depressive disorder, single episode, severe without psychotic features: Secondary | ICD-10-CM | POA: Diagnosis not present

## 2021-07-24 DIAGNOSIS — Z85048 Personal history of other malignant neoplasm of rectum, rectosigmoid junction, and anus: Secondary | ICD-10-CM | POA: Diagnosis not present

## 2021-07-27 ENCOUNTER — Other Ambulatory Visit: Payer: Self-pay

## 2021-07-27 ENCOUNTER — Encounter (HOSPITAL_BASED_OUTPATIENT_CLINIC_OR_DEPARTMENT_OTHER): Payer: Self-pay

## 2021-07-27 ENCOUNTER — Ambulatory Visit (HOSPITAL_BASED_OUTPATIENT_CLINIC_OR_DEPARTMENT_OTHER)
Admission: RE | Admit: 2021-07-27 | Discharge: 2021-07-27 | Disposition: A | Discharge status: Home / Self Care | Payer: Medicare Other | Source: Ambulatory Visit | Attending: Family Medicine | Admitting: Family Medicine

## 2021-07-27 ENCOUNTER — Other Ambulatory Visit (HOSPITAL_BASED_OUTPATIENT_CLINIC_OR_DEPARTMENT_OTHER): Payer: Self-pay | Admitting: Family Medicine

## 2021-07-27 DIAGNOSIS — R9389 Abnormal findings on diagnostic imaging of other specified body structures: Secondary | ICD-10-CM

## 2021-08-03 ENCOUNTER — Other Ambulatory Visit: Payer: Self-pay

## 2021-08-03 ENCOUNTER — Ambulatory Visit (HOSPITAL_BASED_OUTPATIENT_CLINIC_OR_DEPARTMENT_OTHER)
Admission: RE | Admit: 2021-08-03 | Discharge: 2021-08-03 | Disposition: A | Discharge status: Home / Self Care | Payer: Medicare Other | Source: Ambulatory Visit | Attending: Family Medicine | Admitting: Family Medicine

## 2021-08-03 DIAGNOSIS — R9389 Abnormal findings on diagnostic imaging of other specified body structures: Secondary | ICD-10-CM | POA: Insufficient documentation

## 2021-08-03 DIAGNOSIS — E034 Atrophy of thyroid (acquired): Secondary | ICD-10-CM | POA: Diagnosis not present

## 2021-08-03 DIAGNOSIS — E041 Nontoxic single thyroid nodule: Secondary | ICD-10-CM | POA: Diagnosis not present

## 2021-08-07 DIAGNOSIS — R350 Frequency of micturition: Secondary | ICD-10-CM | POA: Diagnosis not present

## 2021-08-13 ENCOUNTER — Encounter (HOSPITAL_BASED_OUTPATIENT_CLINIC_OR_DEPARTMENT_OTHER): Payer: Self-pay

## 2021-08-13 ENCOUNTER — Ambulatory Visit (HOSPITAL_BASED_OUTPATIENT_CLINIC_OR_DEPARTMENT_OTHER)
Admission: RE | Admit: 2021-08-13 | Discharge: 2021-08-13 | Disposition: A | Discharge status: Home / Self Care | Payer: Medicare Other | Source: Ambulatory Visit | Attending: Hematology | Admitting: Hematology

## 2021-08-13 ENCOUNTER — Other Ambulatory Visit: Payer: Self-pay

## 2021-08-13 DIAGNOSIS — Z1231 Encounter for screening mammogram for malignant neoplasm of breast: Secondary | ICD-10-CM | POA: Diagnosis not present

## 2021-08-17 DIAGNOSIS — G5602 Carpal tunnel syndrome, left upper limb: Secondary | ICD-10-CM | POA: Diagnosis not present

## 2021-08-20 DIAGNOSIS — B373 Candidiasis of vulva and vagina: Secondary | ICD-10-CM | POA: Diagnosis not present

## 2021-08-20 DIAGNOSIS — N9089 Other specified noninflammatory disorders of vulva and perineum: Secondary | ICD-10-CM | POA: Diagnosis not present

## 2021-08-22 NOTE — Progress Notes (Deleted)
Penny Guzman   [phone] Fax:([phone]   Clinic Follow up Note   Patient Care Team: Orpah Melter, MD as PCP - General (Family Medicine) 08/22/2021  CHIEF COMPLAINT: Follow up h/o anal cancer   SUMMARY OF ONCOLOGIC HISTORY: Oncology History Overview Note  Cancer Staging Anal cancer [facility]) Staging form: Anus, AJCC 8th Edition - Clinical stage from 02/22/2020: Stage IIIC (cT3, cN1, cM0) - Signed by Alla Feeling, NP on 02/22/2020    Anal cancer ([place])  03/15/2016 Procedure   FINDINGS:  Approximately 4cm nodular inflamed area in rectum just above  dentate line.  Biopsies taken with cold forceps.  To rule out  malignancy. Site marked with ink.  Rest of colon appeared normal IMPRESSION: Nodular inflamed area just above dentate line.  To  rule out malignancy   03/15/2016 Initial Biopsy   FINAL PATHOLOGIC DIAGNOSIS RECTUM, BIOPSY: -AT LEAST  SQUAMOUS CELL CARCINOMA IN SITU, SEE COMMENT  Comment There is no definite evidence of invasion in the sections examined. Clinical correlation and a repeat biopsy may be done if there is a suspicion for invasive carcinoma.   03/15/2016 Initial Diagnosis   Anal cancer ([place])   03/15/2016 Cancer Staging   Cancer Staging Anal cancer [facility]) Staging form: Anus, AJCC 8th Edition - Clinical stage from 02/22/2020: Stage IIIC (cT3, cN1, cM0) - Signed by Alla Feeling, NP on 02/22/2020    03/19/2016 Imaging   Staging CT CAP IMPRESSION 1. Gallstones. 2. Aortic atherosclerosis and branches, mild. 3. Degenerative disc disease L2-S1. 4. Otherwise negative CT chest abdomen and pelvis.   03/26/2016 PET scan   IMPRESSION 1. Intense uptake noted in the distal rectum and anus consistent with malignancy. 2. 1 cm mildly hypermetabolic (max SUV of 2.0) with a lymph node in the right mesorectal fascia which could represent reactive versus pathologic node.   04/05/2016 Imaging   Local staging pelvic MRI IMPRESSION A 5 cm anal  tumor with invasion of the sphincter complex and lower rectum as described above (T3). A suspicious 10 mm round mesorectal lymph node (N1).   04/23/2016 - 05/28/2016 Radiation Therapy   RADIATION THERAPY: Completed 25 of 25 fractions (50 Gy of a planned 50 Gy to anus+pelvis+groins)   04/23/2016 - 05/21/2016 Chemotherapy   CONCURRENT THERAPY: 5-FU and Mitomycin C x 2 cycles     08/14/2016 PET scan   IMPRESSION (post chemoRT PET scan) 1.  There is no definite sign of metabolically active malignancy. Metabolic activity at the site of  previously seen hypermetabolic anal mass is mild and diffuse and appears physiologic. Previously seen  hypermetabolic right sacral node is no longer present.  2.  Incidental CT findings including: Coronary artery and aortic atherosclerosis, cholelithiasis .   09/05/2017 Imaging   Surveillance CT CAP IMPRESSION No evidence of metastatic disease.   09/11/2018 Procedure   Colonoscopy Data Digital Rectal Exam: Normal Quality of Prep: Good Extent of procedure: Cecum Withdrawal Time (Cecum to Anus): 7 mins Retroflexed View Performed: Yes  Colonoscopy Findings/Specimens Bottle #: 1 Location: Descending Colon Specimen Type: Polyp Total number of polyps: 1 Polyp Size (mm): less than 5 mm Type: Sessile Tissue extracted by: Cold Forceps Extraction Comments: (09/11/18 cbs/ka 1350 hx of polyps and anal  ca)     09/14/2018 Pathology Results   FINAL PATHOLOGIC DIAGNOSIS COLON, DESCENDING POLYP, MUCOSAL BIOPSY:       BENIGN COLONIC MUCOSA WITH REACTIVE LYMPHOID AGGREGATE. Comment No micro-organisms, active colitis, dysplasia, or neoplasia seen.   11/13/2018 Imaging  Surveillance CT CAP  Stable CT examination of the chest, abdomen and pelvis compared to 11/05/2017 without metastatic process identified. Clinical follow up according to existing care plan.  Cholelithiasis.  Aortic atherosclerosis.   02/22/2020 Cancer Staging   Staging form: Anus, AJCC 8th  Edition - Clinical stage from 02/22/2020: Stage IIIC (cT3, cN1, cM0) - Signed by Alla Feeling, NP on 02/22/2020   03/31/2020 Imaging   CT CAP  IMPRESSION: Chest Impression:   1. No evidence of pulmonary metastasis. 2. Coronary artery calcification and Aortic Atherosclerosis (ICD10-I70.0).   Abdomen / Pelvis Impression:   1. No evidence local anal carcinoma recurrence. 2. No metastatic adenopathy or visceral metastasis. 3. Cholelithiasis without evidence cholecystitis. 4.  Aortic Atherosclerosis (ICD10-I70.0).     CURRENT THERAPY: Surveillance   INTERVAL HISTORY: Penny Guzman returns for follow up as scheduled. Last seen by Dr. Burr Medico 08/24/20.    REVIEW OF SYSTEMS:   Constitutional: Denies fevers, chills or abnormal weight loss Eyes: Denies blurriness of vision Ears, nose, mouth, throat, and face: Denies mucositis or sore throat Respiratory: Denies cough, dyspnea or wheezes Cardiovascular: Denies palpitation, chest discomfort or lower extremity swelling Gastrointestinal:  Denies nausea, heartburn or change in bowel habits Skin: Denies abnormal skin rashes Lymphatics: Denies new lymphadenopathy or easy bruising Neurological:Denies numbness, tingling or new weaknesses Behavioral/Psych: Mood is stable, no new changes  All other systems were reviewed with the patient and are negative.  MEDICAL HISTORY:  Past Medical History:  Diagnosis Date   Colon cancer ([place]) 03/2016   anal cancer   Depression    GERD (gastroesophageal reflux disease)    Hypertension    Spinal stenosis     SURGICAL HISTORY: Past Surgical History:  Procedure Laterality Date   ovarian cysts      I have reviewed the social history and family history with the patient and they are unchanged from previous note.  ALLERGIES:  is allergic to codeine, propoxyphene, and triamcinolone.  MEDICATIONS:  Current Outpatient Medications  Medication Sig Dispense Refill   aspirin 81 MG chewable tablet Chew by  mouth daily.     atorvastatin (LIPITOR) 20 MG tablet Take 20 mg by mouth daily.     augmented betamethasone dipropionate (DIPROLENE-AF) 0.05 % cream Apply 30 g topically as needed.     b complex vitamins tablet Take 1 tablet by mouth daily.     calcium-vitamin D (OSCAL WITH D) 500-200 MG-UNIT tablet Take 1 tablet by mouth.     co-enzyme Q-10 30 MG capsule Take 30 mg by mouth 3 (three) times daily.     diazepam (VALIUM) 5 MG tablet Take by mouth.     FLUoxetine (PROZAC) 10 MG capsule Take 10 mg by mouth daily.     gabapentin (NEURONTIN) 100 MG capsule Take 200 mg by mouth 2 (two) times daily.     ibuprofen (ADVIL) 200 MG tablet Take 200 mg by mouth daily. 250 mg daily     omeprazole (PRILOSEC) 20 MG capsule Take 20 mg by mouth daily.     temazepam (RESTORIL) 15 MG capsule Take 15 mg by mouth at bedtime as needed.     No current facility-administered medications for this visit.    PHYSICAL EXAMINATION: ECOG PERFORMANCE STATUS: {CHL ONC ECOG PS:[id number]}  There were no vitals filed for this visit. There were no vitals filed for this visit.  GENERAL:alert, no distress and comfortable SKIN: skin color, texture, turgor are normal, no rashes or significant lesions EYES: normal, Conjunctiva are pink and non-injected,  sclera clear OROPHARYNX:no exudate, no erythema and lips, buccal mucosa, and tongue normal  NECK: supple, thyroid normal size, non-tender, without nodularity LYMPH:  no palpable lymphadenopathy in the cervical, axillary or inguinal LUNGS: clear to auscultation and percussion with normal breathing effort HEART: regular rate & rhythm and no murmurs and no lower extremity edema ABDOMEN:abdomen soft, non-tender and normal bowel sounds Musculoskeletal:no cyanosis of digits and no clubbing  NEURO: alert & oriented x 3 with fluent speech, no focal motor/sensory deficits  LABORATORY DATA:  I have reviewed the data as listed CBC Latest Ref Rng & Units 03/22/2021 08/24/2020 02/21/2020   WBC 4.0 - 10.5 K/uL 5.1 4.1 4.6  Hemoglobin 12.0 - 15.0 g/dL 13.8 13.9 14.2  Hematocrit 36.0 - 46.0 % 40.8 41.9 43.0  Platelets 150 - 400 K/uL 309 264 288     CMP Latest Ref Rng & Units 03/22/2021 08/24/2020 02/21/2020  Glucose 70 - 99 mg/dL 100(H) 100(H) 81  BUN 8 - 23 mg/dL 18 19 17   Creatinine 0.44 - 1.00 mg/dL 0.71 0.82 0.77  Sodium 135 - 145 mmol/L 136 141 142  Potassium 3.5 - 5.1 mmol/L 3.8 4.7 4.2  Chloride 98 - 111 mmol/L 105 107 107  CO2 22 - 32 mmol/L 22 28 24   Calcium 8.9 - 10.3 mg/dL 9.2 9.6 9.3  Total Protein 6.5 - 8.1 g/dL 6.9 6.8 6.6  Total Bilirubin 0.3 - 1.2 mg/dL 0.6 0.9 0.7  Alkaline Phos 38 - 126 U/L 100 126 106  AST 15 - 41 U/L 31 26 27   ALT 0 - 44 U/L 33 39 38      RADIOGRAPHIC STUDIES: I have personally reviewed the radiological images as listed and agreed with the findings in the report. No results found.   ASSESSMENT & PLAN:  No problem-specific Assessment & Plan notes found for this encounter.   No orders of the defined types were placed in this encounter.  All questions were answered. The patient knows to call the clinic with any problems, questions or concerns. No barriers to learning was detected. I spent {CHL ONC TIME VISIT - [place]:[id number]} counseling the patient face to face. The total time spent in the appointment was {CHL ONC TIME VISIT - TAVWP:[id number]} and more than 50% was on counseling and review of test results     Alla Feeling, NP 08/22/21

## 2021-08-23 ENCOUNTER — Inpatient Hospital Stay: Payer: Medicare Other | Admitting: Nurse Practitioner

## 2021-08-23 ENCOUNTER — Ambulatory Visit: Payer: Medicare Other | Admitting: Nurse Practitioner

## 2021-08-23 ENCOUNTER — Other Ambulatory Visit: Payer: Medicare Other

## 2021-08-23 ENCOUNTER — Inpatient Hospital Stay: Payer: Medicare Other | Attending: Family Medicine

## 2021-08-29 ENCOUNTER — Telehealth: Payer: Self-pay | Admitting: Nurse Practitioner

## 2021-08-29 NOTE — Telephone Encounter (Signed)
Sch per 9/23 pt request, left message.

## 2021-08-31 DIAGNOSIS — F322 Major depressive disorder, single episode, severe without psychotic features: Secondary | ICD-10-CM | POA: Diagnosis not present

## 2021-08-31 DIAGNOSIS — K219 Gastro-esophageal reflux disease without esophagitis: Secondary | ICD-10-CM | POA: Diagnosis not present

## 2021-08-31 DIAGNOSIS — G4700 Insomnia, unspecified: Secondary | ICD-10-CM | POA: Diagnosis not present

## 2021-08-31 DIAGNOSIS — I10 Essential (primary) hypertension: Secondary | ICD-10-CM | POA: Diagnosis not present

## 2021-09-05 DIAGNOSIS — B3731 Acute candidiasis of vulva and vagina: Secondary | ICD-10-CM | POA: Diagnosis not present

## 2021-09-05 DIAGNOSIS — L900 Lichen sclerosus et atrophicus: Secondary | ICD-10-CM | POA: Diagnosis not present

## 2021-09-05 DIAGNOSIS — N904 Leukoplakia of vulva: Secondary | ICD-10-CM | POA: Diagnosis not present

## 2021-09-11 ENCOUNTER — Other Ambulatory Visit: Payer: Self-pay | Admitting: Nurse Practitioner

## 2021-09-11 DIAGNOSIS — C210 Malignant neoplasm of anus, unspecified: Secondary | ICD-10-CM

## 2021-09-12 ENCOUNTER — Inpatient Hospital Stay: Payer: Medicare Other | Admitting: Nurse Practitioner

## 2021-09-12 ENCOUNTER — Inpatient Hospital Stay: Payer: Medicare Other

## 2021-09-13 DIAGNOSIS — M5416 Radiculopathy, lumbar region: Secondary | ICD-10-CM | POA: Diagnosis not present

## 2021-09-18 NOTE — Progress Notes (Deleted)
[name]   [phone] [phone]   Clinic Follow up Note   Patient Care Team: Orpah Melter, MD as PCP - General (Family Medicine) 09/18/2021  CHIEF COMPLAINT: Follow up h/o anal cancer   SUMMARY OF ONCOLOGIC HISTORY: Oncology History Overview Note  Cancer Staging Anal cancer [facility]) Staging form: Anus, AJCC 8th Edition - Clinical stage from 02/22/2020: Stage IIIC (cT3, cN1, cM0) - Signed by Alla Feeling, NP on 02/22/2020    Anal cancer ([place])  03/15/2016 Procedure   FINDINGS:  Approximately 4cm nodular inflamed area in rectum just above  dentate line.  Biopsies taken with cold forceps.  To rule out  malignancy. Site marked with ink.  Rest of colon appeared normal IMPRESSION: Nodular inflamed area just above dentate line.  To  rule out malignancy   03/15/2016 Initial Biopsy   FINAL PATHOLOGIC DIAGNOSIS RECTUM, BIOPSY: -AT LEAST  SQUAMOUS CELL CARCINOMA IN SITU, SEE COMMENT  Comment There is no definite evidence of invasion in the sections examined. Clinical correlation and a repeat biopsy may be done if there is a suspicion for invasive carcinoma.   03/15/2016 Initial Diagnosis   Anal cancer ([place])   03/15/2016 Cancer Staging   Cancer Staging Anal cancer [facility]) Staging form: Anus, AJCC 8th Edition - Clinical stage from 02/22/2020: Stage IIIC (cT3, cN1, cM0) - Signed by Alla Feeling, NP on 02/22/2020    03/19/2016 Imaging   Staging CT CAP IMPRESSION 1. Gallstones. 2. Aortic atherosclerosis and branches, mild. 3. Degenerative disc disease L2-S1. 4. Otherwise negative CT chest abdomen and pelvis.   03/26/2016 PET scan   IMPRESSION 1. Intense uptake noted in the distal rectum and anus consistent with malignancy. 2. 1 cm mildly hypermetabolic (max SUV of 2.0) with a lymph node in the right mesorectal fascia which could represent reactive versus pathologic node.   04/05/2016 Imaging   Local staging pelvic MRI IMPRESSION A 5 cm anal  tumor with invasion of the sphincter complex and lower rectum as described above (T3). A suspicious 10 mm round mesorectal lymph node (N1).   04/23/2016 - 05/28/2016 Radiation Therapy   RADIATION THERAPY: Completed 25 of 25 fractions (50 Gy of a planned 50 Gy to anus+pelvis+groins)   04/23/2016 - 05/21/2016 Chemotherapy   CONCURRENT THERAPY: 5-FU and Mitomycin C x 2 cycles     08/14/2016 PET scan   IMPRESSION (post chemoRT PET scan) 1.  There is no definite sign of metabolically active malignancy. Metabolic activity at the site of  previously seen hypermetabolic anal mass is mild and diffuse and appears physiologic. Previously seen  hypermetabolic right sacral node is no longer present.  2.  Incidental CT findings including: Coronary artery and aortic atherosclerosis, cholelithiasis .   09/05/2017 Imaging   Surveillance CT CAP IMPRESSION No evidence of metastatic disease.   09/11/2018 Procedure   Colonoscopy Data Digital Rectal Exam: Normal Quality of Prep: Good Extent of procedure: Cecum Withdrawal Time (Cecum to Anus): 7 mins Retroflexed View Performed: Yes  Colonoscopy Findings/Specimens Bottle #: 1 Location: Descending Colon Specimen Type: Polyp Total number of polyps: 1 Polyp Size (mm): less than 5 mm Type: Sessile Tissue extracted by: Cold Forceps Extraction Comments: (09/11/18 cbs/ka 1350 hx of polyps and anal  ca)     09/14/2018 Pathology Results   FINAL PATHOLOGIC DIAGNOSIS COLON, DESCENDING POLYP, MUCOSAL BIOPSY:       BENIGN COLONIC MUCOSA WITH REACTIVE LYMPHOID AGGREGATE. Comment No micro-organisms, active colitis, dysplasia, or neoplasia seen.   11/13/2018 Imaging  Surveillance CT CAP  Stable CT examination of the chest, abdomen and pelvis compared to 11/05/2017 without metastatic process identified. Clinical follow up according to existing care plan.  Cholelithiasis.  Aortic atherosclerosis.   02/22/2020 Cancer Staging   Staging form: Anus, AJCC 8th  Edition - Clinical stage from 02/22/2020: Stage IIIC (cT3, cN1, cM0) - Signed by Alla Feeling, NP on 02/22/2020   03/31/2020 Imaging   CT CAP  IMPRESSION: Chest Impression:   1. No evidence of pulmonary metastasis. 2. Coronary artery calcification and Aortic Atherosclerosis (ICD10-I70.0).   Abdomen / Pelvis Impression:   1. No evidence local anal carcinoma recurrence. 2. No metastatic adenopathy or visceral metastasis. 3. Cholelithiasis without evidence cholecystitis. 4.  Aortic Atherosclerosis (ICD10-I70.0).     CURRENT THERAPY: Surveillance   INTERVAL HISTORY: Penny Guzman returns for follow up. She was last seen by Dr. Burr Medico 08/24/20. She has rescheduled many appointments.    REVIEW OF SYSTEMS:   Constitutional: Denies fevers, chills or abnormal weight loss Eyes: Denies blurriness of vision Ears, nose, mouth, throat, and face: Denies mucositis or sore throat Respiratory: Denies cough, dyspnea or wheezes Cardiovascular: Denies palpitation, chest discomfort or lower extremity swelling Gastrointestinal:  Denies nausea, heartburn or change in bowel habits Skin: Denies abnormal skin rashes Lymphatics: Denies new lymphadenopathy or easy bruising Neurological:Denies numbness, tingling or new weaknesses Behavioral/Psych: Mood is stable, no new changes  All other systems were reviewed with the patient and are negative.  MEDICAL HISTORY:  Past Medical History:  Diagnosis Date   Colon cancer ([place]) 03/2016   anal cancer   Depression    GERD (gastroesophageal reflux disease)    Hypertension    Spinal stenosis     SURGICAL HISTORY: Past Surgical History:  Procedure Laterality Date   ovarian cysts      I have reviewed the social history and family history with the patient and they are unchanged from previous note.  ALLERGIES:  is allergic to codeine, propoxyphene, and triamcinolone.  MEDICATIONS:  Current Outpatient Medications  Medication Sig Dispense Refill   aspirin  81 MG chewable tablet Chew by mouth daily.     atorvastatin (LIPITOR) 20 MG tablet Take 20 mg by mouth daily.     augmented betamethasone dipropionate (DIPROLENE-AF) 0.05 % cream Apply 30 g topically as needed.     b complex vitamins tablet Take 1 tablet by mouth daily.     calcium-vitamin D (OSCAL WITH D) 500-200 MG-UNIT tablet Take 1 tablet by mouth.     co-enzyme Q-10 30 MG capsule Take 30 mg by mouth 3 (three) times daily.     diazepam (VALIUM) 5 MG tablet Take by mouth.     FLUoxetine (PROZAC) 10 MG capsule Take 10 mg by mouth daily.     gabapentin (NEURONTIN) 100 MG capsule Take 200 mg by mouth 2 (two) times daily.     ibuprofen (ADVIL) 200 MG tablet Take 200 mg by mouth daily. 250 mg daily     omeprazole (PRILOSEC) 20 MG capsule Take 20 mg by mouth daily.     temazepam (RESTORIL) 15 MG capsule Take 15 mg by mouth at bedtime as needed.     No current facility-administered medications for this visit.    PHYSICAL EXAMINATION: ECOG PERFORMANCE STATUS: {CHL ONC ECOG PS:[id number]}  There were no vitals filed for this visit. There were no vitals filed for this visit.  GENERAL:alert, no distress and comfortable SKIN: skin color, texture, turgor are normal, no rashes or significant lesions EYES: normal,  Conjunctiva are pink and non-injected, sclera clear OROPHARYNX:no exudate, no erythema and lips, buccal mucosa, and tongue normal  NECK: supple, thyroid normal size, non-tender, without nodularity LYMPH:  no palpable lymphadenopathy in the cervical, axillary or inguinal LUNGS: clear to auscultation and percussion with normal breathing effort HEART: regular rate & rhythm and no murmurs and no lower extremity edema ABDOMEN:abdomen soft, non-tender and normal bowel sounds Musculoskeletal:no cyanosis of digits and no clubbing  NEURO: alert & oriented x 3 with fluent speech, no focal motor/sensory deficits  LABORATORY DATA:  I have reviewed the data as listed CBC Latest Ref Rng & Units  03/22/2021 08/24/2020 02/21/2020  WBC 4.0 - 10.5 K/uL 5.1 4.1 4.6  Hemoglobin 12.0 - 15.0 g/dL 13.8 13.9 14.2  Hematocrit 36.0 - 46.0 % 40.8 41.9 43.0  Platelets 150 - 400 K/uL 309 264 288     CMP Latest Ref Rng & Units 03/22/2021 08/24/2020 02/21/2020  Glucose 70 - 99 mg/dL 100(H) 100(H) 81  BUN 8 - 23 mg/dL 18 19 17   Creatinine 0.44 - 1.00 mg/dL 0.71 0.82 0.77  Sodium 135 - 145 mmol/L 136 141 142  Potassium 3.5 - 5.1 mmol/L 3.8 4.7 4.2  Chloride 98 - 111 mmol/L 105 107 107  CO2 22 - 32 mmol/L 22 28 24   Calcium 8.9 - 10.3 mg/dL 9.2 9.6 9.3  Total Protein 6.5 - 8.1 g/dL 6.9 6.8 6.6  Total Bilirubin 0.3 - 1.2 mg/dL 0.6 0.9 0.7  Alkaline Phos 38 - 126 U/L 100 126 106  AST 15 - 41 U/L 31 26 27   ALT 0 - 44 U/L 33 39 38      RADIOGRAPHIC STUDIES: I have personally reviewed the radiological images as listed and agreed with the findings in the report. No results found.   ASSESSMENT & PLAN:  No problem-specific Assessment & Plan notes found for this encounter.   No orders of the defined types were placed in this encounter.  All questions were answered. The patient knows to call the clinic with any problems, questions or concerns. No barriers to learning was detected. I spent {CHL ONC TIME VISIT - EFEOF:[id number]} counseling the patient face to face. The total time spent in the appointment was {CHL ONC TIME VISIT - PQDIY:[id number]} and more than 50% was on counseling and review of test results     Alla Feeling, NP 09/18/21

## 2021-09-19 ENCOUNTER — Inpatient Hospital Stay: Payer: Medicare Other | Admitting: Nurse Practitioner

## 2021-09-19 ENCOUNTER — Inpatient Hospital Stay: Payer: Medicare Other

## 2021-09-20 DIAGNOSIS — R300 Dysuria: Secondary | ICD-10-CM | POA: Diagnosis not present

## 2021-09-25 DIAGNOSIS — L900 Lichen sclerosus et atrophicus: Secondary | ICD-10-CM | POA: Diagnosis not present

## 2021-10-08 DIAGNOSIS — H903 Sensorineural hearing loss, bilateral: Secondary | ICD-10-CM | POA: Diagnosis not present

## 2021-10-11 DIAGNOSIS — Z23 Encounter for immunization: Secondary | ICD-10-CM | POA: Diagnosis not present

## 2021-10-11 DIAGNOSIS — L570 Actinic keratosis: Secondary | ICD-10-CM | POA: Diagnosis not present

## 2021-10-16 NOTE — Progress Notes (Deleted)
[place]   Telephone:(336) [phone] Fax:(336) [phone]   Clinic Follow up Note   Patient Care Team: Orpah Melter, MD as PCP - General (Family Medicine) 10/16/2021  CHIEF COMPLAINT: Follow up h/o anal cancer   SUMMARY OF ONCOLOGIC HISTORY: Oncology History Overview Note  Cancer Staging Anal cancer [facility]) Staging form: Anus, AJCC 8th Edition - Clinical stage from 02/22/2020: Stage IIIC (cT3, cN1, cM0) - Signed by Alla Feeling, NP on 02/22/2020    Anal cancer ([place])  03/15/2016 Procedure   FINDINGS:  Approximately 4cm nodular inflamed area in rectum just above  dentate line.  Biopsies taken with cold forceps.  To rule out  malignancy. Site marked with ink.  Rest of colon appeared normal IMPRESSION: Nodular inflamed area just above dentate line.  To  rule out malignancy   03/15/2016 Initial Biopsy   FINAL PATHOLOGIC DIAGNOSIS RECTUM, BIOPSY: -AT LEAST  SQUAMOUS CELL CARCINOMA IN SITU, SEE COMMENT  Comment There is no definite evidence of invasion in the sections examined. Clinical correlation and a repeat biopsy may be done if there is a suspicion for invasive carcinoma.   03/15/2016 Initial Diagnosis   Anal cancer ([place])   03/15/2016 Cancer Staging   Cancer Staging Anal cancer [facility]) Staging form: Anus, AJCC 8th Edition - Clinical stage from 02/22/2020: Stage IIIC (cT3, cN1, cM0) - Signed by Alla Feeling, NP on 02/22/2020    03/19/2016 Imaging   Staging CT CAP IMPRESSION 1. Gallstones. 2. Aortic atherosclerosis and branches, mild. 3. Degenerative disc disease L2-S1. 4. Otherwise negative CT chest abdomen and pelvis.   03/26/2016 PET scan   IMPRESSION 1. Intense uptake noted in the distal rectum and anus consistent with malignancy. 2. 1 cm mildly hypermetabolic (max SUV of 2.0) with a lymph node in the right mesorectal fascia which could represent reactive versus pathologic node.   04/05/2016 Imaging   Local staging pelvic MRI IMPRESSION A 5 cm anal  tumor with invasion of the sphincter complex and lower rectum as described above (T3). A suspicious 10 mm round mesorectal lymph node (N1).   04/23/2016 - 05/28/2016 Radiation Therapy   RADIATION THERAPY: Completed 25 of 25 fractions (50 Gy of a planned 50 Gy to anus+pelvis+groins)   04/23/2016 - 05/21/2016 Chemotherapy   CONCURRENT THERAPY: 5-FU and Mitomycin C x 2 cycles     08/14/2016 PET scan   IMPRESSION (post chemoRT PET scan) 1.  There is no definite sign of metabolically active malignancy. Metabolic activity at the site of  previously seen hypermetabolic anal mass is mild and diffuse and appears physiologic. Previously seen  hypermetabolic right sacral node is no longer present.  2.  Incidental CT findings including: Coronary artery and aortic atherosclerosis, cholelithiasis .   09/05/2017 Imaging   Surveillance CT CAP IMPRESSION No evidence of metastatic disease.   09/11/2018 Procedure   Colonoscopy Data Digital Rectal Exam: Normal Quality of Prep: Good Extent of procedure: Cecum Withdrawal Time (Cecum to Anus): 7 mins Retroflexed View Performed: Yes  Colonoscopy Findings/Specimens Bottle #: 1 Location: Descending Colon Specimen Type: Polyp Total number of polyps: 1 Polyp Size (mm): less than 5 mm Type: Sessile Tissue extracted by: Cold Forceps Extraction Comments: (09/11/18 cbs/ka 1350 hx of polyps and anal  ca)     09/14/2018 Pathology Results   FINAL PATHOLOGIC DIAGNOSIS COLON, DESCENDING POLYP, MUCOSAL BIOPSY:       BENIGN COLONIC MUCOSA WITH REACTIVE LYMPHOID AGGREGATE. Comment No micro-organisms, active colitis, dysplasia, or neoplasia seen.   11/13/2018 Imaging  Surveillance CT CAP  Stable CT examination of the chest, abdomen and pelvis compared to 11/05/2017 without metastatic process identified. Clinical follow up according to existing care plan.  Cholelithiasis.  Aortic atherosclerosis.   02/22/2020 Cancer Staging   Staging form: Anus, AJCC 8th  Edition - Clinical stage from 02/22/2020: Stage IIIC (cT3, cN1, cM0) - Signed by Alla Feeling, NP on 02/22/2020    03/31/2020 Imaging   CT CAP  IMPRESSION: Chest Impression:   1. No evidence of pulmonary metastasis. 2. Coronary artery calcification and Aortic Atherosclerosis (ICD10-I70.0).   Abdomen / Pelvis Impression:   1. No evidence local anal carcinoma recurrence. 2. No metastatic adenopathy or visceral metastasis. 3. Cholelithiasis without evidence cholecystitis. 4.  Aortic Atherosclerosis (ICD10-I70.0).     CURRENT THERAPY: Surveillance   INTERVAL HISTORY: Penny Guzman returns for follow up as scheduled. She was last seen by Dr. Burr Medico 08/24/20, she has rescheduled multiple times lately.    REVIEW OF SYSTEMS:   Constitutional: Denies fevers, chills or abnormal weight loss Eyes: Denies blurriness of vision Ears, nose, mouth, throat, and face: Denies mucositis or sore throat Respiratory: Denies cough, dyspnea or wheezes Cardiovascular: Denies palpitation, chest discomfort or lower extremity swelling Gastrointestinal:  Denies nausea, heartburn or change in bowel habits Skin: Denies abnormal skin rashes Lymphatics: Denies new lymphadenopathy or easy bruising Neurological:Denies numbness, tingling or new weaknesses Behavioral/Psych: Mood is stable, no new changes  All other systems were reviewed with the patient and are negative.  MEDICAL HISTORY:  Past Medical History:  Diagnosis Date   Colon cancer ([place]) 03/2016   anal cancer   Depression    GERD (gastroesophageal reflux disease)    Hypertension    Spinal stenosis     SURGICAL HISTORY: Past Surgical History:  Procedure Laterality Date   ovarian cysts      I have reviewed the social history and family history with the patient and they are unchanged from previous note.  ALLERGIES:  is allergic to codeine, propoxyphene, and triamcinolone.  MEDICATIONS:  Current Outpatient Medications  Medication Sig  Dispense Refill   aspirin 81 MG chewable tablet Chew by mouth daily.     atorvastatin (LIPITOR) 20 MG tablet Take 20 mg by mouth daily.     augmented betamethasone dipropionate (DIPROLENE-AF) 0.05 % cream Apply 30 g topically as needed.     b complex vitamins tablet Take 1 tablet by mouth daily.     calcium-vitamin D (OSCAL WITH D) 500-200 MG-UNIT tablet Take 1 tablet by mouth.     co-enzyme Q-10 30 MG capsule Take 30 mg by mouth 3 (three) times daily.     diazepam (VALIUM) 5 MG tablet Take by mouth.     FLUoxetine (PROZAC) 10 MG capsule Take 10 mg by mouth daily.     gabapentin (NEURONTIN) 100 MG capsule Take 200 mg by mouth 2 (two) times daily.     ibuprofen (ADVIL) 200 MG tablet Take 200 mg by mouth daily. 250 mg daily     omeprazole (PRILOSEC) 20 MG capsule Take 20 mg by mouth daily.     temazepam (RESTORIL) 15 MG capsule Take 15 mg by mouth at bedtime as needed.     No current facility-administered medications for this visit.    PHYSICAL EXAMINATION: ECOG PERFORMANCE STATUS: {CHL ONC ECOG [phone]}  There were no vitals filed for this visit. There were no vitals filed for this visit.  GENERAL:alert, no distress and comfortable SKIN: skin color, texture, turgor are normal, no rashes or  significant lesions EYES: normal, Conjunctiva are pink and non-injected, sclera clear OROPHARYNX:no exudate, no erythema and lips, buccal mucosa, and tongue normal  NECK: supple, thyroid normal size, non-tender, without nodularity LYMPH:  no palpable lymphadenopathy in the cervical, axillary or inguinal LUNGS: clear to auscultation and percussion with normal breathing effort HEART: regular rate & rhythm and no murmurs and no lower extremity edema ABDOMEN:abdomen soft, non-tender and normal bowel sounds Musculoskeletal:no cyanosis of digits and no clubbing  NEURO: alert & oriented x 3 with fluent speech, no focal motor/sensory deficits  LABORATORY DATA:  I have reviewed the data as  listed CBC Latest Ref Rng & Units 03/22/2021 08/24/2020 02/21/2020  WBC 4.0 - 10.5 K/uL 5.1 4.1 4.6  Hemoglobin 12.0 - 15.0 g/dL 13.8 13.9 14.2  Hematocrit 36.0 - 46.0 % 40.8 41.9 43.0  Platelets 150 - 400 K/uL 309 264 288     CMP Latest Ref Rng & Units 03/22/2021 08/24/2020 02/21/2020  Glucose 70 - 99 mg/dL 100(H) 100(H) 81  BUN 8 - 23 mg/dL 18 19 17   Creatinine 0.44 - 1.00 mg/dL 0.71 0.82 0.77  Sodium 135 - 145 mmol/L 136 141 142  Potassium 3.5 - 5.1 mmol/L 3.8 4.7 4.2  Chloride 98 - 111 mmol/L 105 107 107  CO2 22 - 32 mmol/L 22 28 24   Calcium 8.9 - 10.3 mg/dL 9.2 9.6 9.3  Total Protein 6.5 - 8.1 g/dL 6.9 6.8 6.6  Total Bilirubin 0.3 - 1.2 mg/dL 0.6 0.9 0.7  Alkaline Phos 38 - 126 U/L 100 126 106  AST 15 - 41 U/L 31 26 27   ALT 0 - 44 U/L 33 39 38      RADIOGRAPHIC STUDIES: I have personally reviewed the radiological images as listed and agreed with the findings in the report. No results found.   ASSESSMENT & PLAN:  No problem-specific Assessment & Plan notes found for this encounter.   No orders of the defined types were placed in this encounter.  All questions were answered. The patient knows to call the clinic with any problems, questions or concerns. No barriers to learning was detected. I spent {CHL ONC TIME VISIT - LDJTT:[id number]} counseling the patient face to face. The total time spent in the appointment was {CHL ONC TIME VISIT - SPQZR:[id number]} and more than 50% was on counseling and review of test results     Alla Feeling, NP 10/16/21

## 2021-10-17 ENCOUNTER — Inpatient Hospital Stay: Payer: Medicare Other | Admitting: Nurse Practitioner

## 2021-10-17 ENCOUNTER — Inpatient Hospital Stay: Payer: Medicare Other

## 2021-11-05 DIAGNOSIS — L900 Lichen sclerosus et atrophicus: Secondary | ICD-10-CM | POA: Diagnosis not present

## 2021-11-28 DIAGNOSIS — Z131 Encounter for screening for diabetes mellitus: Secondary | ICD-10-CM | POA: Diagnosis not present

## 2021-11-28 DIAGNOSIS — L219 Seborrheic dermatitis, unspecified: Secondary | ICD-10-CM | POA: Diagnosis not present

## 2021-11-28 DIAGNOSIS — Z0000 Encounter for general adult medical examination without abnormal findings: Secondary | ICD-10-CM | POA: Diagnosis not present

## 2021-11-28 DIAGNOSIS — M79661 Pain in right lower leg: Secondary | ICD-10-CM | POA: Diagnosis not present

## 2021-11-29 DIAGNOSIS — G894 Chronic pain syndrome: Secondary | ICD-10-CM | POA: Diagnosis not present

## 2021-11-29 DIAGNOSIS — M5416 Radiculopathy, lumbar region: Secondary | ICD-10-CM | POA: Diagnosis not present

## 2021-11-29 DIAGNOSIS — F419 Anxiety disorder, unspecified: Secondary | ICD-10-CM | POA: Diagnosis not present

## 2021-11-30 DIAGNOSIS — N390 Urinary tract infection, site not specified: Secondary | ICD-10-CM | POA: Diagnosis not present

## 2021-11-30 DIAGNOSIS — Z13220 Encounter for screening for lipoid disorders: Secondary | ICD-10-CM | POA: Diagnosis not present

## 2021-11-30 DIAGNOSIS — E7800 Pure hypercholesterolemia, unspecified: Secondary | ICD-10-CM | POA: Diagnosis not present

## 2021-11-30 DIAGNOSIS — Z0000 Encounter for general adult medical examination without abnormal findings: Secondary | ICD-10-CM | POA: Diagnosis not present

## 2021-11-30 DIAGNOSIS — Z131 Encounter for screening for diabetes mellitus: Secondary | ICD-10-CM | POA: Diagnosis not present

## 2021-12-03 NOTE — Progress Notes (Deleted)
[place] Guz[place]   [phone] Fax:(336) [phone]   Clinic Follow up Note   Patient Care Team: Orpah Melter, MD as PCP - General (Family Medicine) 12/03/2021  CHIEF COMPLAINT: Follow up h/o anal cancer   SUMMARY OF ONCOLOGIC HISTORY: Oncology History Overview Note  Cancer Staging Anal cancer [facility]) Staging form: Anus, AJCC 8th Edition - Clinical stage from 02/22/2020: Stage IIIC (cT3, cN1, cM0) - Signed by Alla Feeling, NP on 02/22/2020    Anal cancer ([place])  03/15/2016 Procedure   FINDINGS:  Approximately 4cm nodular inflamed area in rectum just above  dentate line.  Biopsies taken with cold forceps.  To rule out  malignancy. Site marked with ink.  Rest of colon appeared normal IMPRESSION: Nodular inflamed area just above dentate line.  To  rule out malignancy   03/15/2016 Initial Biopsy   FINAL PATHOLOGIC DIAGNOSIS RECTUM, BIOPSY: -AT LEAST  SQUAMOUS CELL CARCINOMA IN SITU, SEE COMMENT  Comment There is no definite evidence of invasion in the sections examined. Clinical correlation and a repeat biopsy may be done if there is a suspicion for invasive carcinoma.   03/15/2016 Initial Diagnosis   Anal cancer ([place])   03/15/2016 Cancer Staging   Cancer Staging Anal cancer [facility]) Staging form: Anus, AJCC 8th Edition - Clinical stage from 02/22/2020: Stage IIIC (cT3, cN1, cM0) - Signed by Alla Feeling, NP on 02/22/2020    03/19/2016 Imaging   Staging CT CAP IMPRESSION 1. Gallstones. 2. Aortic atherosclerosis and branches, mild. 3. Degenerative disc disease L2-S1. 4. Otherwise negative CT chest abdomen and pelvis.   03/26/2016 PET scan   IMPRESSION 1. Intense uptake noted in the distal rectum and anus consistent with malignancy. 2. 1 cm mildly hypermetabolic (max SUV of 2.0) with a lymph node in the right mesorectal fascia which could represent reactive versus pathologic node.   04/05/2016 Imaging   Local staging pelvic MRI IMPRESSION A 5 cm anal  tumor with invasion of the sphincter complex and lower rectum as described above (T3). A suspicious 10 mm round mesorectal lymph node (N1).   04/23/2016 - 05/28/2016 Radiation Therapy   RADIATION THERAPY: Completed 25 of 25 fractions (50 Gy of a planned 50 Gy to anus+pelvis+groins)   04/23/2016 - 05/21/2016 Chemotherapy   CONCURRENT THERAPY: 5-FU and Mitomycin C x 2 cycles     08/14/2016 PET scan   IMPRESSION (post chemoRT PET scan) 1.  There is no definite sign of metabolically active malignancy. Metabolic activity at the site of  previously seen hypermetabolic anal mass is mild and diffuse and appears physiologic. Previously seen  hypermetabolic right sacral node is no longer present.  2.  Incidental CT findings including: Coronary artery and aortic atherosclerosis, cholelithiasis .   09/05/2017 Imaging   Surveillance CT CAP IMPRESSION No evidence of metastatic disease.   09/11/2018 Procedure   Colonoscopy Data Digital Rectal Exam: Normal Quality of Prep: Good Extent of procedure: Cecum Withdrawal Time (Cecum to Anus): 7 mins Retroflexed View Performed: Yes  Colonoscopy Findings/Specimens Bottle #: 1 Location: Descending Colon Specimen Type: Polyp Total number of polyps: 1 Polyp Size (mm): less than 5 mm Type: Sessile Tissue extracted by: Cold Forceps Extraction Comments: (09/11/18 cbs/ka 1350 hx of polyps and anal  ca)     09/14/2018 Pathology Results   FINAL PATHOLOGIC DIAGNOSIS COLON, DESCENDING POLYP, MUCOSAL BIOPSY:       BENIGN COLONIC MUCOSA WITH REACTIVE LYMPHOID AGGREGATE. Comment No micro-organisms, active colitis, dysplasia, or neoplasia seen.   11/13/2018 Imaging  Surveillance CT CAP  Stable CT examination of the chest, abdomen and pelvis compared to 11/05/2017 without metastatic process identified. Clinical follow up according to existing care plan.  Cholelithiasis.  Aortic atherosclerosis.   02/22/2020 Cancer Staging   Staging form: Anus, AJCC 8th  Edition - Clinical stage from 02/22/2020: Stage IIIC (cT3, cN1, cM0) - Signed by Alla Feeling, NP on 02/22/2020    03/31/2020 Imaging   CT CAP  IMPRESSION: Chest Impression:   1. No evidence of pulmonary metastasis. 2. Coronary artery calcification and Aortic Atherosclerosis (ICD10-I70.0).   Abdomen / Pelvis Impression:   1. No evidence local anal carcinoma recurrence. 2. No metastatic adenopathy or visceral metastasis. 3. Cholelithiasis without evidence cholecystitis. 4.  Aortic Atherosclerosis (ICD10-I70.0).     CURRENT THERAPY: Surveillance   INTERVAL HISTORY: Penny Guzman returns for follow up as scheduled. Last seen by Dr. Burr Medico 08/24/20, she no showed for multiple appointments over the past few months.    REVIEW OF SYSTEMS:   Constitutional: Denies fevers, chills or abnormal weight loss Eyes: Denies blurriness of vision Ears, nose, mouth, throat, and face: Denies mucositis or sore throat Respiratory: Denies cough, dyspnea or wheezes Cardiovascular: Denies palpitation, chest discomfort or lower extremity swelling Gastrointestinal:  Denies nausea, heartburn or change in bowel habits Skin: Denies abnormal skin rashes Lymphatics: Denies new lymphadenopathy or easy bruising Neurological:Denies numbness, tingling or new weaknesses Behavioral/Psych: Mood is stable, no new changes  All other systems were reviewed with the patient and are negative.  MEDICAL HISTORY:  Past Medical History:  Diagnosis Date   Colon cancer ([place]) 03/2016   anal cancer   Depression    GERD (gastroesophageal reflux disease)    Hypertension    Spinal stenosis     SURGICAL HISTORY: Past Surgical History:  Procedure Laterality Date   ovarian cysts      I have reviewed the social history and family history with the patient and they are unchanged from previous note.  ALLERGIES:  is allergic to codeine, propoxyphene, and triamcinolone.  MEDICATIONS:  Current Outpatient Medications   Medication Sig Dispense Refill   aspirin 81 MG chewable tablet Chew by mouth daily.     atorvastatin (LIPITOR) 20 MG tablet Take 20 mg by mouth daily.     augmented betamethasone dipropionate (DIPROLENE-AF) 0.05 % cream Apply 30 g topically as needed.     b complex vitamins tablet Take 1 tablet by mouth daily.     calcium-vitamin D (OSCAL WITH D) 500-200 MG-UNIT tablet Take 1 tablet by mouth.     co-enzyme Q-10 30 MG capsule Take 30 mg by mouth 3 (three) times daily.     diazepam (VALIUM) 5 MG tablet Take by mouth.     FLUoxetine (PROZAC) 10 MG capsule Take 10 mg by mouth daily.     gabapentin (NEURONTIN) 100 MG capsule Take 200 mg by mouth 2 (two) times daily.     ibuprofen (ADVIL) 200 MG tablet Take 200 mg by mouth daily. 250 mg daily     omeprazole (PRILOSEC) 20 MG capsule Take 20 mg by mouth daily.     temazepam (RESTORIL) 15 MG capsule Take 15 mg by mouth at bedtime as needed.     No current facility-administered medications for this visit.    PHYSICAL EXAMINATION: ECOG PERFORMANCE STATUS: {CHL ONC ECOG [phone]}  There were no vitals filed for this visit. There were no vitals filed for this visit.  GENERAL:alert, no distress and comfortable SKIN: skin color, texture, turgor are normal,  no rashes or significant lesions EYES: normal, Conjunctiva are pink and non-injected, sclera clear OROPHARYNX:no exudate, no erythema and lips, buccal mucosa, and tongue normal  NECK: supple, thyroid normal size, non-tender, without nodularity LYMPH:  no palpable lymphadenopathy in the cervical, axillary or inguinal LUNGS: clear to auscultation and percussion with normal breathing effort HEART: regular rate & rhythm and no murmurs and no lower extremity edema ABDOMEN:abdomen soft, non-tender and normal bowel sounds Musculoskeletal:no cyanosis of digits and no clubbing  NEURO: alert & oriented x 3 with fluent speech, no focal motor/sensory deficits  LABORATORY DATA:  I have reviewed the  data as listed CBC Latest Ref Rng & Units 03/22/2021 08/24/2020 02/21/2020  WBC 4.0 - 10.5 K/uL 5.1 4.1 4.6  Hemoglobin 12.0 - 15.0 g/dL 13.8 13.9 14.2  Hematocrit 36.0 - 46.0 % 40.8 41.9 43.0  Platelets 150 - 400 K/uL 309 264 288     CMP Latest Ref Rng & Units 03/22/2021 08/24/2020 02/21/2020  Glucose 70 - 99 mg/dL 100(H) 100(H) 81  BUN 8 - 23 mg/dL 18 19 17   Creatinine 0.44 - 1.00 mg/dL 0.71 0.82 0.77  Sodium 135 - 145 mmol/L 136 141 142  Potassium 3.5 - 5.1 mmol/L 3.8 4.7 4.2  Chloride 98 - 111 mmol/L 105 107 107  CO2 22 - 32 mmol/L 22 28 24   Calcium 8.9 - 10.3 mg/dL 9.2 9.6 9.3  Total Protein 6.5 - 8.1 g/dL 6.9 6.8 6.6  Total Bilirubin 0.3 - 1.2 mg/dL 0.6 0.9 0.7  Alkaline Phos 38 - 126 U/L 100 126 106  AST 15 - 41 U/L 31 26 27   ALT 0 - 44 U/L 33 39 38      RADIOGRAPHIC STUDIES: I have personally reviewed the radiological images as listed and agreed with the findings in the report. No results found.   ASSESSMENT & PLAN:  No problem-specific Assessment & Plan notes found for this encounter.   No orders of the defined types were placed in this encounter.  All questions were answered. The patient knows to call the clinic with any problems, questions or concerns. No barriers to learning was detected. I spent {CHL ONC TIME VISIT - RXYVO:[id number]} counseling the patient face to face. The total time spent in the appointment was {CHL ONC TIME VISIT - MNOTR:[id number]} and more than 50% was on counseling and review of test results     Alla Feeling, NP 12/03/21

## 2021-12-05 ENCOUNTER — Inpatient Hospital Stay: Payer: Medicare Other | Admitting: Nurse Practitioner

## 2021-12-05 ENCOUNTER — Inpatient Hospital Stay: Payer: Medicare Other

## 2021-12-10 DIAGNOSIS — H35033 Hypertensive retinopathy, bilateral: Secondary | ICD-10-CM | POA: Diagnosis not present

## 2021-12-13 DIAGNOSIS — M5416 Radiculopathy, lumbar region: Secondary | ICD-10-CM | POA: Diagnosis not present

## 2021-12-26 DIAGNOSIS — F322 Major depressive disorder, single episode, severe without psychotic features: Secondary | ICD-10-CM | POA: Diagnosis not present

## 2021-12-26 DIAGNOSIS — R5382 Chronic fatigue, unspecified: Secondary | ICD-10-CM | POA: Diagnosis not present

## 2021-12-26 DIAGNOSIS — I10 Essential (primary) hypertension: Secondary | ICD-10-CM | POA: Diagnosis not present

## 2021-12-26 DIAGNOSIS — R748 Abnormal levels of other serum enzymes: Secondary | ICD-10-CM | POA: Diagnosis not present

## 2022-01-02 ENCOUNTER — Encounter: Payer: Self-pay | Admitting: Nurse Practitioner

## 2022-01-02 ENCOUNTER — Inpatient Hospital Stay: Payer: Medicare Other | Attending: Nurse Practitioner

## 2022-01-02 ENCOUNTER — Other Ambulatory Visit: Payer: Self-pay

## 2022-01-02 ENCOUNTER — Inpatient Hospital Stay: Payer: Medicare Other | Admitting: Nurse Practitioner

## 2022-01-02 VITALS — BP 139/87 | HR 79 | Temp 98.0°F | Resp 17 | Wt 165.1 lb

## 2022-01-02 DIAGNOSIS — Z923 Personal history of irradiation: Secondary | ICD-10-CM | POA: Insufficient documentation

## 2022-01-02 DIAGNOSIS — I700 Atherosclerosis of aorta: Secondary | ICD-10-CM | POA: Insufficient documentation

## 2022-01-02 DIAGNOSIS — F32A Depression, unspecified: Secondary | ICD-10-CM | POA: Diagnosis not present

## 2022-01-02 DIAGNOSIS — M4800 Spinal stenosis, site unspecified: Secondary | ICD-10-CM | POA: Insufficient documentation

## 2022-01-02 DIAGNOSIS — R197 Diarrhea, unspecified: Secondary | ICD-10-CM | POA: Insufficient documentation

## 2022-01-02 DIAGNOSIS — I2510 Atherosclerotic heart disease of native coronary artery without angina pectoris: Secondary | ICD-10-CM | POA: Diagnosis not present

## 2022-01-02 DIAGNOSIS — K219 Gastro-esophageal reflux disease without esophagitis: Secondary | ICD-10-CM | POA: Diagnosis not present

## 2022-01-02 DIAGNOSIS — M5136 Other intervertebral disc degeneration, lumbar region: Secondary | ICD-10-CM | POA: Diagnosis not present

## 2022-01-02 DIAGNOSIS — G4700 Insomnia, unspecified: Secondary | ICD-10-CM | POA: Diagnosis not present

## 2022-01-02 DIAGNOSIS — C210 Malignant neoplasm of anus, unspecified: Secondary | ICD-10-CM | POA: Diagnosis not present

## 2022-01-02 DIAGNOSIS — K8020 Calculus of gallbladder without cholecystitis without obstruction: Secondary | ICD-10-CM | POA: Diagnosis not present

## 2022-01-02 DIAGNOSIS — Z885 Allergy status to narcotic agent status: Secondary | ICD-10-CM | POA: Insufficient documentation

## 2022-01-02 DIAGNOSIS — K5900 Constipation, unspecified: Secondary | ICD-10-CM | POA: Diagnosis not present

## 2022-01-02 DIAGNOSIS — Z79899 Other long term (current) drug therapy: Secondary | ICD-10-CM | POA: Insufficient documentation

## 2022-01-02 DIAGNOSIS — I10 Essential (primary) hypertension: Secondary | ICD-10-CM | POA: Diagnosis not present

## 2022-01-02 LAB — CBC WITH DIFFERENTIAL (CANCER CENTER ONLY)
Abs Immature Granulocytes: 0 10*3/uL (ref 0.00–0.07)
Basophils Absolute: 0 10*3/uL (ref 0.0–0.1)
Basophils Relative: 1 %
Eosinophils Absolute: 0.2 10*3/uL (ref 0.0–0.5)
Eosinophils Relative: 4 %
HCT: 40.2 % (ref 36.0–46.0)
Hemoglobin: 13.5 g/dL (ref 12.0–15.0)
Immature Granulocytes: 0 %
Lymphocytes Relative: 22 %
Lymphs Abs: 0.8 10*3/uL (ref 0.7–4.0)
MCH: 31.3 pg (ref 26.0–34.0)
MCHC: 33.6 g/dL (ref 30.0–36.0)
MCV: 93.3 fL (ref 80.0–100.0)
Monocytes Absolute: 0.4 10*3/uL (ref 0.1–1.0)
Monocytes Relative: 12 %
Neutro Abs: 2.2 10*3/uL (ref 1.7–7.7)
Neutrophils Relative %: 61 %
Platelet Count: 243 10*3/uL (ref 150–400)
RBC: 4.31 MIL/uL (ref 3.87–5.11)
RDW: 13.3 % (ref 11.5–15.5)
WBC Count: 3.6 10*3/uL — ABNORMAL LOW (ref 4.0–10.5)
nRBC: 0 % (ref 0.0–0.2)

## 2022-01-02 LAB — CMP (CANCER CENTER ONLY)
ALT: 29 U/L (ref 0–44)
AST: 23 U/L (ref 15–41)
Albumin: 3.9 g/dL (ref 3.5–5.0)
Alkaline Phosphatase: 107 U/L (ref 38–126)
Anion gap: 6 (ref 5–15)
BUN: 19 mg/dL (ref 8–23)
CO2: 28 mmol/L (ref 22–32)
Calcium: 9.5 mg/dL (ref 8.9–10.3)
Chloride: 106 mmol/L (ref 98–111)
Creatinine: 0.75 mg/dL (ref 0.44–1.00)
GFR, Estimated: >60 mL/min (ref >60)
Glucose, Bld: 69 mg/dL — ABNORMAL LOW (ref 70–99)
Potassium: 4 mmol/L (ref 3.5–5.1)
Sodium: 140 mmol/L (ref 135–145)
Total Bilirubin: 1 mg/dL (ref 0.3–1.2)
Total Protein: 6.5 g/dL (ref 6.5–8.1)

## 2022-01-02 NOTE — Progress Notes (Signed)
[place]   [phone] [phone]) [phone]   Clinic Follow up Note   Patient Care Team: Orpah Melter, MD as PCP - General (Family Medicine) 01/02/2022  CHIEF COMPLAINT: Follow up h/o anal cancer   SUMMARY OF ONCOLOGIC HISTORY: Oncology History Overview Note  Cancer Staging Anal cancer [facility]) Staging form: Anus, AJCC 8th Edition - Clinical stage from 02/22/2020: Stage IIIC (cT3, cN1, cM0) - Signed by Alla Feeling, NP on 02/22/2020    Anal cancer ([place])  03/15/2016 Procedure   FINDINGS:  Approximately 4cm nodular inflamed area in rectum just above  dentate line.  Biopsies taken with cold forceps.  To rule out  malignancy. Site marked with ink.  Rest of colon appeared normal IMPRESSION: Nodular inflamed area just above dentate line.  To  rule out malignancy   03/15/2016 Initial Biopsy   FINAL PATHOLOGIC DIAGNOSIS RECTUM, BIOPSY: -AT LEAST  SQUAMOUS CELL CARCINOMA IN SITU, SEE COMMENT  Comment There is no definite evidence of invasion in the sections examined. Clinical correlation and a repeat biopsy may be done if there is a suspicion for invasive carcinoma.   03/15/2016 Initial Diagnosis   Anal cancer ([place])   03/15/2016 Cancer Staging   Cancer Staging Anal cancer [facility]) Staging form: Anus, AJCC 8th Edition - Clinical stage from 02/22/2020: Stage IIIC (cT3, cN1, cM0) - Signed by Alla Feeling, NP on 02/22/2020    03/19/2016 Imaging   Staging CT CAP IMPRESSION 1. Gallstones. 2. Aortic atherosclerosis and branches, mild. 3. Degenerative disc disease L2-S1. 4. Otherwise negative CT chest abdomen and pelvis.   03/26/2016 PET scan   IMPRESSION 1. Intense uptake noted in the distal rectum and anus consistent with malignancy. 2. 1 cm mildly hypermetabolic (max SUV of 2.0) with a lymph node in the right mesorectal fascia which could represent reactive versus pathologic node.   04/05/2016 Imaging   Local staging pelvic MRI IMPRESSION A 5 cm anal  tumor with invasion of the sphincter complex and lower rectum as described above (T3). A suspicious 10 mm round mesorectal lymph node (N1).   04/23/2016 - 05/28/2016 Radiation Therapy   RADIATION THERAPY: Completed 25 of 25 fractions (50 Gy of a planned 50 Gy to anus+pelvis+groins)   04/23/2016 - 05/21/2016 Chemotherapy   CONCURRENT THERAPY: 5-FU and Mitomycin C x 2 cycles     08/14/2016 PET scan   IMPRESSION (post chemoRT PET scan) 1.  There is no definite sign of metabolically active malignancy. Metabolic activity at the site of  previously seen hypermetabolic anal mass is mild and diffuse and appears physiologic. Previously seen  hypermetabolic right sacral node is no longer present.  2.  Incidental CT findings including: Coronary artery and aortic atherosclerosis, cholelithiasis .   09/05/2017 Imaging   Surveillance CT CAP IMPRESSION No evidence of metastatic disease.   09/11/2018 Procedure   Colonoscopy Data Digital Rectal Exam: Normal Quality of Prep: Good Extent of procedure: Cecum Withdrawal Time (Cecum to Anus): 7 mins Retroflexed View Performed: Yes  Colonoscopy Findings/Specimens Bottle #: 1 Location: Descending Colon Specimen Type: Polyp Total number of polyps: 1 Polyp Size (mm): less than 5 mm Type: Sessile Tissue extracted by: Cold Forceps Extraction Comments: (09/11/18 cbs/ka 1350 hx of polyps and anal  ca)     09/14/2018 Pathology Results   FINAL PATHOLOGIC DIAGNOSIS COLON, DESCENDING POLYP, MUCOSAL BIOPSY:       BENIGN COLONIC MUCOSA WITH REACTIVE LYMPHOID AGGREGATE. Comment No micro-organisms, active colitis, dysplasia, or neoplasia seen.   11/13/2018 Imaging  Surveillance CT CAP  Stable CT examination of the chest, abdomen and pelvis compared to 11/05/2017 without metastatic process identified. Clinical follow up according to existing care plan.  Cholelithiasis.  Aortic atherosclerosis.   02/22/2020 Cancer Staging   Staging form: Anus, AJCC 8th  Edition - Clinical stage from 02/22/2020: Stage IIIC (cT3, cN1, cM0) - Signed by Alla Feeling, NP on 02/22/2020    03/31/2020 Imaging   CT CAP  IMPRESSION: Chest Impression:   1. No evidence of pulmonary metastasis. 2. Coronary artery calcification and Aortic Atherosclerosis (ICD10-I70.0).   Abdomen / Pelvis Impression:   1. No evidence local anal carcinoma recurrence. 2. No metastatic adenopathy or visceral metastasis. 3. Cholelithiasis without evidence cholecystitis. 4.  Aortic Atherosclerosis (ICD10-I70.0).     CURRENT THERAPY: Surveillance   INTERVAL HISTORY: Ms. Dutko returns for follow up as scheduled. Last seen 08/24/20 for surveillance. She has rescheduled many appointments in the last few months due to things coming up or forgetting. She is doing well in general. Since last visit she has been diagnosed with HTN, spinal stenosis, and lichen sclerosis. She sees PCP. Thinks she has had colonoscopy but can't remember with who. Since chemoRT she alternates between constipation and loose stool. On Norco for spinal stenosis now she is more constipated. Has occasional blood streaks after hard stool. She doesn't have great control of her sphinchter, she thinks this is causing her to get more UTI's. Appetite is good, energy is low she attributes to her medications. Denies n/v, abdominal pain/bloating, fever, chills, cough, chest pain, dyspnea, leg edema, or other specific concerns.     MEDICAL HISTORY:  Past Medical History:  Diagnosis Date   Colon cancer ([place]) 03/2016   anal cancer   Depression    GERD (gastroesophageal reflux disease)    Hypertension    Spinal stenosis     SURGICAL HISTORY: Past Surgical History:  Procedure Laterality Date   ovarian cysts      I have reviewed the social history and family history with the patient and they are unchanged from previous note.  ALLERGIES:  is allergic to codeine, propoxyphene, and triamcinolone.  MEDICATIONS:  Current  Outpatient Medications  Medication Sig Dispense Refill   aspirin 81 MG chewable tablet Chew by mouth daily.     atorvastatin (LIPITOR) 20 MG tablet Take 20 mg by mouth daily.     augmented betamethasone dipropionate (DIPROLENE-AF) 0.05 % cream Apply 30 g topically as needed.     b complex vitamins tablet Take 1 tablet by mouth daily.     calcium-vitamin D (OSCAL WITH D) 500-200 MG-UNIT tablet Take 1 tablet by mouth.     co-enzyme Q-10 30 MG capsule Take 30 mg by mouth 3 (three) times daily.     diazepam (VALIUM) 5 MG tablet Take by mouth.     FLUoxetine (PROZAC) 10 MG capsule Take 10 mg by mouth daily.     FLUoxetine (PROZAC) 20 MG capsule Take 20 mg by mouth daily.     gabapentin (NEURONTIN) 100 MG capsule Take 200 mg by mouth 2 (two) times daily.     gabapentin (NEURONTIN) 300 MG capsule Take 300 mg by mouth 4 (four) times daily.     HYDROcodone-acetaminophen (NORCO) 10-325 MG tablet Take 1 tablet by mouth 3 (three) times daily as needed.     ibuprofen (ADVIL) 200 MG tablet Take 200 mg by mouth daily. 250 mg daily     losartan (COZAAR) 25 MG tablet losartan 25 mg tablet  TAKE  1 TABLET BY MOUTH EVERY DAY     omeprazole (PRILOSEC) 20 MG capsule Take 20 mg by mouth daily.     temazepam (RESTORIL) 15 MG capsule Take 15 mg by mouth at bedtime as needed.     traMADol (ULTRAM) 50 MG tablet Take 50 mg by mouth 2 (two) times daily as needed.     No current facility-administered medications for this visit.    PHYSICAL EXAMINATION: ECOG PERFORMANCE STATUS: 1 - Symptomatic but completely ambulatory  Vitals:   01/02/22 1102  BP: 139/87  Pulse: 79  Resp: 17  Temp: 98 F (36.7 C)  SpO2: 98%   Filed Weights   01/02/22 1102  Weight: 165 lb 2 oz (74.9 kg)    GENERAL:alert, no distress and comfortable SKIN: no rash  EYES: sclera clear NECK: without mass LYMPH:  no palpable cervical, supraclavicular, or inguinal lymphadenopathy LUNGS: clear with normal breathing effort HEART: regular  rate & rhythm, no lower extremity edema ABDOMEN:abdomen soft, non-tender and normal bowel sounds NEURO: alert & oriented x 3 with fluent speech, no focal motor/sensory deficits Rectal exam: external exam reveals skin change from prior RT. Normal tone. No external or internal mass that I could appreciate. Stool in anal canal, no blood on glove.   LABORATORY DATA:  I have reviewed the data as listed CBC Latest Ref Rng & Units 01/02/2022 03/22/2021 08/24/2020  WBC 4.0 - 10.5 K/uL 3.6(L) 5.1 4.1  Hemoglobin 12.0 - 15.0 g/dL 13.5 13.8 13.9  Hematocrit 36.0 - 46.0 % 40.2 40.8 41.9  Platelets 150 - 400 K/uL 243 309 264     CMP Latest Ref Rng & Units 01/02/2022 03/22/2021 08/24/2020  Glucose 70 - 99 mg/dL 69(L) 100(H) 100(H)  BUN 8 - 23 mg/dL 19 18 19   Creatinine 0.44 - 1.00 mg/dL 0.75 0.71 0.82  Sodium 135 - 145 mmol/L 140 136 141  Potassium 3.5 - 5.1 mmol/L 4.0 3.8 4.7  Chloride 98 - 111 mmol/L 106 105 107  CO2 22 - 32 mmol/L 28 22 28   Calcium 8.9 - 10.3 mg/dL 9.5 9.2 9.6  Total Protein 6.5 - 8.1 g/dL 6.5 6.9 6.8  Total Bilirubin 0.3 - 1.2 mg/dL 1.0 0.6 0.9  Alkaline Phos 38 - 126 U/L 107 100 126  AST 15 - 41 U/L 23 31 26   ALT 0 - 44 U/L 29 33 39      RADIOGRAPHIC STUDIES: I have personally reviewed the radiological images as listed and agreed with the findings in the report. No results found.   ASSESSMENT & PLAN: 71 yo female with    1. H/o squamous cell carcinoma of the anus, cT3N1 stage IIIA -She was diagnosed with clinical T3N1 locally advanced anal cancer in 03/2016 on routine screening colonoscopy. She underwent curative chemoRT with 5FU/mitomycin. Colonoscopy (2019) and last surveillance CT CAP 03/31/2020 showed no evidence of disease   -She thinks she had a colonoscopy since moving to [place] in 2020, not readily available on patient's MyChart, she will contact [facility]  -He has completed 5-year surveillance in our clinic, she can follow-up with PCP/GI in the future   2. GERD, HL,  Depression, insomnia, HTN, spinal stenosis, lichen sclerosis -Continue medical management and f/u with PCP    3. Health maintenance -I encouraged her to remain active with physical exercise and healthy diet and lifestyle.  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 Olen Pel at [place]   Disposition:  Ms. Lise Auer is clinically  doing well.  Exam is unremarkable, labs normal.  There is no clinical evidence of cancer recurrence.  She would like to achieve better control of her anal sphincter, I recommend pelvic floor PT and she is interested, I referred her.    She is over 5 years from initial diagnosis and treatment, the recurrence risk has significantly decreased.  She can graduate from our clinic and follow-up with PCP and GI in the future.  She will contact [facility] to determine the date of her next screening colonoscopy.  Encouraged her to live a healthy active lifestyle, avoid smoking, limit alcohol, and stay up-to-date on age-appropriate cancer screenings.  She can return to [place] as needed in the future.   Orders Placed This Encounter  Procedures   Ambulatory referral to Physical Therapy    Referral Priority:   Routine    Referral Type:   Physical Medicine    Referral Reason:   Specialty Services Required    Requested Specialty:   Physical Therapy    Number of Visits Requested:   1   All questions were answered. The patient knows to call the clinic with any problems, questions or concerns. No barriers to learning was detected. I spent 20 minutes counseling the patient face to face. The total time spent in the appointment was 30 minutes and more than 50% was on counseling and review of test results     Alla Feeling, NP 01/02/22

## 2022-01-07 ENCOUNTER — Encounter: Payer: Self-pay | Admitting: Nurse Practitioner

## 2022-01-25 DIAGNOSIS — R748 Abnormal levels of other serum enzymes: Secondary | ICD-10-CM | POA: Diagnosis not present

## 2022-01-25 DIAGNOSIS — I10 Essential (primary) hypertension: Secondary | ICD-10-CM | POA: Diagnosis not present

## 2022-01-25 DIAGNOSIS — Z85048 Personal history of other malignant neoplasm of rectum, rectosigmoid junction, and anus: Secondary | ICD-10-CM | POA: Diagnosis not present

## 2022-02-20 DIAGNOSIS — H3562 Retinal hemorrhage, left eye: Secondary | ICD-10-CM | POA: Diagnosis not present

## 2022-02-20 DIAGNOSIS — H35033 Hypertensive retinopathy, bilateral: Secondary | ICD-10-CM | POA: Diagnosis not present

## 2022-02-20 DIAGNOSIS — R030 Elevated blood-pressure reading, without diagnosis of hypertension: Secondary | ICD-10-CM | POA: Diagnosis not present

## 2022-03-02 DIAGNOSIS — M5416 Radiculopathy, lumbar region: Secondary | ICD-10-CM | POA: Diagnosis not present

## 2022-03-02 DIAGNOSIS — M5136 Other intervertebral disc degeneration, lumbar region: Secondary | ICD-10-CM | POA: Diagnosis not present

## 2022-04-04 DIAGNOSIS — Z5181 Encounter for therapeutic drug level monitoring: Secondary | ICD-10-CM | POA: Diagnosis not present

## 2022-04-04 DIAGNOSIS — M7062 Trochanteric bursitis, left hip: Secondary | ICD-10-CM | POA: Diagnosis not present

## 2022-04-04 DIAGNOSIS — M5136 Other intervertebral disc degeneration, lumbar region: Secondary | ICD-10-CM | POA: Diagnosis not present

## 2022-04-04 DIAGNOSIS — Z79891 Long term (current) use of opiate analgesic: Secondary | ICD-10-CM | POA: Diagnosis not present

## 2022-04-15 NOTE — Progress Notes (Unsigned)
Cardiology Office Note:    Date:  04/15/2022   ID:  Penny Guzman, DOB 1951-03-25, MRN [id number]  PCP:  Orpah Melter, MD   [facility] Providers Cardiologist:  None {   Referring MD: Carolee Rota, NP     History of Present Illness:    Penny Guzman is a 71 y.o. female with a hx of HTN, anal cancer (Stage IIIC) s/p XRT and treatment with 5-FU and mitomycin, aortic atherosclerosis and GERD who was referred by Carolee Rota, NP for further evaluation of HTN.   Patient was seen by Carolee Rota, NP in 01/2022. Note reviewed. Patient requested to establish care with a Cardiologist for prevention measures given long-standing history of HTN.  Today, ***  Past Medical History:  Diagnosis Date   Colon cancer ([place]) 03/2016   anal cancer   Depression    GERD (gastroesophageal reflux disease)    Hypertension    Spinal stenosis     Past Surgical History:  Procedure Laterality Date   ovarian cysts      Current Medications: No outpatient medications have been marked as taking for the 04/24/22 encounter (Appointment) with Freada Bergeron, MD.     Allergies:   Codeine, Propoxyphene, and Triamcinolone   Social History   Socioeconomic History   Marital status: Married    Spouse name: Not on file   Number of children: Not on file   Years of education: Not on file   Highest education level: Not on file  Occupational History   Occupation: Teacher    Comment: Retired  Tobacco Use   Smoking status: Never   Smokeless tobacco: Never  Substance and Sexual Activity   Alcohol use: Yes    Comment: occ   Drug use: Never   Sexual activity: Not on file  Other Topics Concern   Not on file  Social History Narrative   Not on file   Social Determinants of Health   Financial Resource Strain: Not on file  Food Insecurity: Not on file  Transportation Needs: Not on file  Physical Activity: Not on file  Stress: Not on file  Social Connections: Not on file      Family History: The patient's ***family history includes Cancer in her daughter, father, and mother.  ROS:   Please see the history of present illness.    *** All other systems reviewed and are negative.  EKGs/Labs/Other Studies Reviewed:    The following studies were reviewed today: ***  EKG:  EKG is *** ordered today.  The ekg ordered today demonstrates ***  Recent Labs: 01/02/2022: ALT 29; BUN 19; Creatinine 0.75; Hemoglobin 13.5; Platelet Count 243; Potassium 4.0; Sodium 140  Recent Lipid Panel No results found for: CHOL, TRIG, HDL, CHOLHDL, VLDL, LDLCALC, LDLDIRECT   Risk Assessment/Calculations:   {Does this patient have ATRIAL FIBRILLATION?:[phone]}       Physical Exam:    VS:  There were no vitals taken for this visit.    Wt Readings from Last 3 Encounters:  01/02/22 165 lb 2 oz (74.9 kg)  03/22/21 172 lb (78 kg)  08/24/20 184 lb (83.5 kg)     GEN: *** Well nourished, well developed in no acute distress HEENT: Normal NECK: No JVD; No carotid bruits LYMPHATICS: No lymphadenopathy CARDIAC: ***RRR, no murmurs, rubs, gallops RESPIRATORY:  Clear to auscultation without rales, wheezing or rhonchi  ABDOMEN: Soft, non-tender, non-distended MUSCULOSKELETAL:  No edema; No deformity  SKIN: Warm and dry NEUROLOGIC:  Alert and oriented x  3 PSYCHIATRIC:  Normal affect   ASSESSMENT:    No diagnosis found. PLAN:    In order of problems listed above:  #HTN: -Continue losartan '25mg'$  daily  #Coronary artery calcification: #Aortic atherosclerosis: Noted on CT chest in 2021. Currently with no anginal symptoms. LDL in 2019 132.  -Start crestor '10mg'$  daily      {Are you ordering a CV Procedure (e.g. stress test, cath, DCCV, TEE, etc)?   Press F2        :[id number]}    Medication Adjustments/Labs and Tests Ordered: Current medicines are reviewed at length with the patient today.  Concerns regarding medicines are outlined above.  No orders of the defined types  were placed in this encounter.  No orders of the defined types were placed in this encounter.   There are no Patient Instructions on file for this visit.   Signed, Freada Bergeron, MD  04/15/2022 2:55 PM    [place]

## 2022-04-24 ENCOUNTER — Ambulatory Visit: Payer: Medicare Other | Admitting: Cardiology

## 2022-04-24 ENCOUNTER — Encounter: Payer: Self-pay | Admitting: Cardiology

## 2022-04-24 VITALS — BP 124/80 | HR 66 | Ht 65.0 in | Wt 151.0 lb

## 2022-04-24 DIAGNOSIS — Z9221 Personal history of antineoplastic chemotherapy: Secondary | ICD-10-CM

## 2022-04-24 DIAGNOSIS — I2510 Atherosclerotic heart disease of native coronary artery without angina pectoris: Secondary | ICD-10-CM

## 2022-04-24 DIAGNOSIS — R0602 Shortness of breath: Secondary | ICD-10-CM | POA: Diagnosis not present

## 2022-04-24 DIAGNOSIS — I2584 Coronary atherosclerosis due to calcified coronary lesion: Secondary | ICD-10-CM

## 2022-04-24 DIAGNOSIS — E785 Hyperlipidemia, unspecified: Secondary | ICD-10-CM

## 2022-04-24 DIAGNOSIS — R072 Precordial pain: Secondary | ICD-10-CM | POA: Diagnosis not present

## 2022-04-24 DIAGNOSIS — Z79899 Other long term (current) drug therapy: Secondary | ICD-10-CM | POA: Diagnosis not present

## 2022-04-24 DIAGNOSIS — I700 Atherosclerosis of aorta: Secondary | ICD-10-CM

## 2022-04-24 MED ORDER — METOPROLOL TARTRATE 100 MG PO TABS: 100.0000 mg | ORAL_TABLET | Freq: Once | ORAL | 0 refills | Status: DC

## 2022-04-24 NOTE — Progress Notes (Signed)
Cardiology Office Note:    Date:  04/24/2022   ID:  Penny Guzman, DOB 1951/12/01, MRN [id number]  PCP:  Orpah Melter, MD   [facility] Providers Cardiologist:  None {   Referring MD: Carolee Rota, NP     History of Present Illness:    Penny Guzman is a 71 y.o. female with a hx of HTN, anal cancer (Stage IIIC) s/p XRT and treatment with 5-FU and mitomycin, aortic atherosclerosis and GERD who was referred by Carolee Rota, NP for further evaluation of family history of CHF and exertional fatigue.  Patient was seen by Carolee Rota, NP in 01/2022. Note reviewed. Patient requested to establish care with a Cardiologist for prevention measures given long-standing history of HTN, family history of CAD, and fatigue.  Today, the patient states that she has been having decreased energy and exercise capacity over the past couple of years. She used to be able to walk 10000 steps per day but now is getting about 6000 steps a day as she feels more wiped out. No associated chest pain, but just feels more fatigued than previous. She continues to perform water aerobics and feels okay during the class.   She also states that yesterday she suddenly developed severe pain radiating proximally from her feet in both LE. Her legs felt frozen and she felt nauseous. She also complained of tingling and weakness in her bilateral feet. This was after she got up from a daily nap to use the restroom, and then she lied down again. Has no symptoms with exertion and this pain occurred during rest.   Additionally she has occasional chest pressure while lying down, similar to a panic attack associated with dyspnea. This prompts her to get up quickly. Does not have this sensation with exertion.   Rarely she may feel dizzy when standing up after squatting or bending over.  She endorses GERD currently treated with omeprazole. She is trying to restructure her regimen to avoid medications that would exacerbate  her symptoms. Her acid reflux is also triggered by heavy, greasy foods which she tries to avoid.  In her family, her mother had CHF and died at about 57 yo and was a known smoker.  She denies any palpitations, or peripheral edema. No headaches, orthopnea, or PND.  In 2017 she completed her chemotherapy.   Past Medical History:  Diagnosis Date   Colon cancer ([place]) 03/2016   anal cancer   Depression    GERD (gastroesophageal reflux disease)    Hypertension    Spinal stenosis     Past Surgical History:  Procedure Laterality Date   ovarian cysts      Current Medications: Current Meds  Medication Sig   aspirin 81 MG chewable tablet Chew by mouth daily.   augmented betamethasone dipropionate (DIPROLENE-AF) 0.05 % cream Apply 30 g topically as needed.   b complex vitamins tablet Take 1 tablet by mouth daily.   calcium-vitamin D (OSCAL WITH D) 500-200 MG-UNIT tablet Take 1 tablet by mouth.   co-enzyme Q-10 30 MG capsule Take 30 mg by mouth 3 (three) times daily.   diazepam (VALIUM) 5 MG tablet Take by mouth.   FLUoxetine (PROZAC) 20 MG capsule Take 10 mg by mouth daily.   gabapentin (NEURONTIN) 300 MG capsule Take 300 mg by mouth daily.   HYDROcodone-acetaminophen (NORCO) 10-325 MG tablet Take 1 tablet by mouth 3 (three) times daily as needed.   ibuprofen (ADVIL) 200 MG tablet Take 200 mg by mouth daily.  250 mg daily   losartan (COZAAR) 25 MG tablet losartan 25 mg tablet  TAKE 1 TABLET BY MOUTH EVERY DAY   metoprolol tartrate (LOPRESSOR) 100 MG tablet Take 1 tablet (100 mg total) by mouth once for 1 dose. Take 90-120 minutes prior to scan.   omeprazole (PRILOSEC) 10 MG capsule Take 10 mg by mouth daily.   temazepam (RESTORIL) 15 MG capsule Take 15 mg by mouth at bedtime as needed.   traMADol (ULTRAM) 50 MG tablet Take 50 mg by mouth 2 (two) times daily as needed.     Allergies:   Codeine, Propoxyphene, and Triamcinolone   Social History   Socioeconomic History   Marital  status: Married    Spouse name: Not on file   Number of children: Not on file   Years of education: Not on file   Highest education level: Not on file  Occupational History   Occupation: Pharmacist, hospital    Comment: Retired  Tobacco Use   Smoking status: Never   Smokeless tobacco: Never  Substance and Sexual Activity   Alcohol use: Yes    Comment: occ   Drug use: Never   Sexual activity: Not on file  Other Topics Concern   Not on file  Social History Narrative   Not on file   Social Determinants of Health   Financial Resource Strain: Not on file  Food Insecurity: Not on file  Transportation Needs: Not on file  Physical Activity: Not on file  Stress: Not on file  Social Connections: Not on file     Family History: The patient's family history includes Cancer in her daughter, father, and mother.  ROS:   Review of Systems  Constitutional:  Positive for malaise/fatigue. Negative for chills and fever.  HENT:  Negative for hearing loss and nosebleeds.   Eyes:  Negative for double vision.  Respiratory:  Positive for shortness of breath. Negative for cough and sputum production.   Cardiovascular:  Positive for chest pain (Pressure). Negative for palpitations, orthopnea, claudication, leg swelling and PND.  Gastrointestinal:  Positive for heartburn and nausea. Negative for vomiting.  Genitourinary:  Negative for hematuria.  Musculoskeletal:  Positive for myalgias. Negative for falls.  Neurological:  Negative for loss of consciousness and weakness.  Endo/Heme/Allergies:  Negative for polydipsia.  Psychiatric/Behavioral:  Negative for depression and suicidal ideas.     EKGs/Labs/Other Studies Reviewed:    The following studies were reviewed today:  CT Chest/Abdomen/Pelvis  03/31/2020: FINDINGS: CT CHEST FINDINGS   Cardiovascular: Coronary artery calcification and aortic atherosclerotic calcification.   Mediastinum/Nodes: No axillary or supraclavicular adenopathy.  No mediastinal or hilar adenopathy. No pericardial effusion. Small hiatal hernia.   Lungs/Pleura: No suspicious pulmonary nodularity.  Airways normal.   Musculoskeletal: No aggressive osseous lesion.   CT ABDOMEN AND PELVIS FINDINGS   Hepatobiliary: No focal hepatic lesion.  Several gallstones noted.   Pancreas: Pancreas is normal. No ductal dilatation. No pancreatic inflammation.   Spleen: Normal spleen   Adrenals/urinary tract: Adrenal glands and kidneys are normal. The ureters and bladder normal.   Stomach/Bowel: Stomach, small-bowel, cecum normal. Appendix not identified. Ascending, transverse and descending colon normal. Rectosigmoid colon normal. No mesenteric adenopathy. No perirectal adenopathy.   Vascular/Lymphatic: Abdominal aorta is normal caliber with atherosclerotic calcification. There is no retroperitoneal or periportal lymphadenopathy. No pelvic lymphadenopathy. No inguinal adenopathy.   Reproductive: Uterus normal.  Adnexa unremarkable.   Other: No free fluid.   Musculoskeletal: No aggressive osseous lesion. Degenerative spurring of the lumbar spine. Endplate  sclerosis.   IMPRESSION: Chest Impression:   1. No evidence of pulmonary metastasis. 2. Coronary artery calcification and Aortic Atherosclerosis (ICD10-I70.0).   Abdomen / Pelvis Impression:   1. No evidence local anal carcinoma recurrence. 2. No metastatic adenopathy or visceral metastasis. 3. Cholelithiasis without evidence cholecystitis. 4.  Aortic Atherosclerosis (ICD10-I70.0).   EKG:  EKG is personally reviewed. 04/24/2022:  Sinus rhythm. Rate 66 bpm. Left axis devia . Low voltage. Poor R wave progression.    Recent Labs: 01/02/2022: ALT 29; BUN 19; Creatinine 0.75; Hemoglobin 13.5; Platelet Count 243; Potassium 4.0; Sodium 140   Recent Lipid Panel No results found for: CHOL, TRIG, HDL, CHOLHDL, VLDL, LDLCALC, LDLDIRECT   Risk Assessment/Calculations:           Physical Exam:     VS:  BP 124/80   Pulse 66   Ht '5\' 5"'$  (1.651 m)   Wt 151 lb (68.5 kg)   SpO2 97%   BMI 25.13 kg/m     Wt Readings from Last 3 Encounters:  04/24/22 151 lb (68.5 kg)  01/02/22 165 lb 2 oz (74.9 kg)  03/22/21 172 lb (78 kg)     GEN: Well nourished, well developed in no acute distress HEENT: Normal NECK: No JVD; No carotid bruits CARDIAC: RR, occasional skipped beats, no murmurs RESPIRATORY:  Clear to auscultation without rales, wheezing or rhonchi  ABDOMEN: Soft, non-tender, non-distended MUSCULOSKELETAL:  No edema; No deformity  SKIN: Warm and dry NEUROLOGIC:  Alert and oriented x 3 PSYCHIATRIC:  Normal affect   ASSESSMENT:    1. Precordial pain   2. SOB (shortness of breath)   3. History of chemotherapy   4. Medication management   5. Hyperlipidemia, unspecified hyperlipidemia type   6. Aortic atherosclerosis ([place])   7. Coronary artery calcification    PLAN:    In order of problems listed above:  #Exertional Fatigue: #Decreased Exercise Capacity: #Coronary artery calcification: #Abnormal ECG: Patient reports gradual decrease in exercise capacity over the past year. She finds she feels more wiped out with exertion and has been having difficulty performing the exercises she used to. Given known CAD on CT chest, abnormal ECG, family history of HF, and risk factors, will check coronary CTA and TTE for further evaluation. -Check coronary CTA -Check TTE -Continue ASA '81mg'$  daily for now; can adjust pending CTA findings   #Aortic atherosclerosis: #HLD: Noted on CT chest in 2021. Currently with no anginal symptoms. LDL 150 in 11/2021 (thinks lipitor was increased at this time) -Continue lipitor '20mg'$  daily -Repeat lipids  #LE Pain: Does not sound like claudication. More likely related to muscle spasm and underlying neuropathy. No symptoms with exertion.  -Continue management per PCP  #HTN: Well controlled and at goal. -Continue losartan '25mg'$  daily         Follow-up:  6 months.  Medication Adjustments/Labs and Tests Ordered: Current medicines are reviewed at length with the patient today.  Concerns regarding medicines are outlined above.   Orders Placed This Encounter  Procedures   CT CORONARY MORPH W/CTA COR W/SCORE W/CA W/CM &/OR WO/CM   Lipid Profile   Basic metabolic panel   EKG 67-YPPJ   ECHOCARDIOGRAM COMPLETE   Meds ordered this encounter  Medications   metoprolol tartrate (LOPRESSOR) 100 MG tablet    Sig: Take 1 tablet (100 mg total) by mouth once for 1 dose. Take 90-120 minutes prior to scan.    Dispense:  1 tablet    Refill:  0   Patient Instructions  Medication Instructions:   Your physician recommends that you continue on your current medications as directed. Please refer to the Current Medication list given to you today.  *If you need a refill on your cardiac medications before your next appointment, please call your pharmacy*   Lab Work:  [place] OFFICE--CHECK BMET AND LIPIDS--COME FASTING   If you have labs (blood work) drawn today and your tests are completely normal, you will receive your results only by: [place] (if you have MyChart) OR A paper copy in the mail If you have any lab test that is abnormal or we need to change your treatment, we will call you to review the results.   Testing/Procedures:  Your physician has requested that you have an echocardiogram. Echocardiography is a painless test that uses sound waves to create images of your heart. It provides your doctor with information about the size and shape of your heart and how well your heart's chambers and valves are working. This procedure takes approximately one hour. There are no restrictions for this procedure.  DO ECHO WITH STRAIN PER DR. Johney Frame     Your cardiac CT will be scheduled at one of the below locations:   [facility] [address] [place], [place] [address] [phone]   If  scheduled at [facility], please arrive at the [facility] and Children's Entrance (Entrance C2) of [facility] 30 minutes prior to test start time. You can use the FREE valet parking offered at entrance C (encouraged to control the heart rate for the test)  Proceed to the [facility] Radiology Department (first floor) to check-in and test prep.  All radiology patients and guests should use entrance C2 at [facility], accessed from [facility], even though the hospital's physical address listed is [address].     Please follow these instructions carefully (unless otherwise directed):   On the Night Before the Test: Be sure to Drink plenty of water. Do not consume any caffeinated/decaffeinated beverages or chocolate 12 hours prior to your test. Do not take any antihistamines 12 hours prior to your test.   On the Day of the Test: Drink plenty of water until 1 hour prior to the test. Do not eat any food 4 hours prior to the test. You may take your regular medications prior to the test.  Take metoprolol 100 MG BY MOUTH (Lopressor) two hours prior to test. FEMALES- please wear underwire-free bra if available, avoid dresses & tight clothing   After the Test: Drink plenty of water. After receiving IV contrast, you may experience a mild flushed feeling. This is normal. On occasion, you may experience a mild rash up to 24 hours after the test. This is not dangerous. If this occurs, you can take Benadryl 25 mg and increase your fluid intake. If you experience trouble breathing, this can be serious. If it is severe call 911 IMMEDIATELY. If it is mild, please call our office. If you take any of these medications: Glipizide/Metformin, Avandament, Glucavance, please do not take 48 hours after completing test unless otherwise instructed.  We will call to schedule your test 2-4 weeks out understanding that some insurance companies will need an authorization  prior to the service being performed.   For non-scheduling related questions, please contact the cardiac imaging nurse navigator should you have any questions/concerns: Marchia Bond, Cardiac Imaging Nurse Navigator Gordy Clement, Cardiac Imaging Nurse Navigator [place] [facility] Direct Office Dial: [phone]   For scheduling needs, including cancellations and rescheduling, please call [place], [phone].   Follow-Up: At [facility], you and your health needs are our priority.  As part of our continuing mission to provide you with exceptional heart care, we have created designated Provider Care Teams.  These Care Teams include your primary Cardiologist (physician) and Advanced Practice Providers (APPs -  Physician Assistants and Nurse Practitioners) who all work together to provide you with the care you need, when you need it.  We recommend signing up for the patient portal called "MyChart".  Sign up information is provided on this After Visit Summary.  MyChart is used to connect with patients for Virtual Visits (Telemedicine).  Patients are able to view lab/test results, encounter notes, upcoming appointments, etc.  Non-urgent messages can be sent to your provider as well.   To learn more about what you can do with MyChart, go to [email or web address].    Your next appointment:   6 month(s)  The format for your next appointment:   In Person  Provider:   DR. Johney Frame   Important Information About Sugar         I,Mathew Stumpf,acting as a scribe for Freada Bergeron, MD.,have documented all relevant documentation on the behalf of Freada Bergeron, MD,as directed by  Freada Bergeron, MD while in the presence of Freada Bergeron, MD.  I, Freada Bergeron, MD, have reviewed all documentation for this visit. The documentation on 04/24/22 for the exam, diagnosis, procedures, and orders are all accurate and complete.   Signed, Freada Bergeron, MD  04/24/2022 11:03 AM    [place]

## 2022-04-24 NOTE — Patient Instructions (Signed)
Medication Instructions:   Your physician recommends that you continue on your current medications as directed. Please refer to the Current Medication list given to you today.  *If you need a refill on your cardiac medications before your next appointment, please call your pharmacy*   Lab Work:  [place] OFFICE--CHECK BMET AND LIPIDS--COME FASTING   If you have labs (blood work) drawn today and your tests are completely normal, you will receive your results only by: [place] (if you have MyChart) OR A paper copy in the mail If you have any lab test that is abnormal or we need to change your treatment, we will call you to review the results.   Testing/Procedures:  Your physician has requested that you have an echocardiogram. Echocardiography is a painless test that uses sound waves to create images of your heart. It provides your doctor with information about the size and shape of your heart and how well your heart's chambers and valves are working. This procedure takes approximately one hour. There are no restrictions for this procedure.  DO ECHO WITH STRAIN PER DR. Johney Frame     Your cardiac CT will be scheduled at one of the below locations:   [facility] [address] [place], [place] [address] [phone]   If scheduled at [facility], please arrive at the [facility] and Children's Entrance (Entrance C2) of [facility] 30 minutes prior to test start time. You can use the FREE valet parking offered at entrance C (encouraged to control the heart rate for the test)  Proceed to the [facility] Radiology Department (first floor) to check-in and test prep.  All radiology patients and guests should use entrance C2 at [facility], accessed from [facility], even though the hospital's physical address listed is [address]     Please follow these instructions carefully (unless otherwise  directed):   On the Night Before the Test: Be sure to Drink plenty of water. Do not consume any caffeinated/decaffeinated beverages or chocolate 12 hours prior to your test. Do not take any antihistamines 12 hours prior to your test.   On the Day of the Test: Drink plenty of water until 1 hour prior to the test. Do not eat any food 4 hours prior to the test. You may take your regular medications prior to the test.  Take metoprolol 100 MG BY MOUTH (Lopressor) two hours prior to test. FEMALES- please wear underwire-free bra if available, avoid dresses & tight clothing   After the Test: Drink plenty of water. After receiving IV contrast, you may experience a mild flushed feeling. This is normal. On occasion, you may experience a mild rash up to 24 hours after the test. This is not dangerous. If this occurs, you can take Benadryl 25 mg and increase your fluid intake. If you experience trouble breathing, this can be serious. If it is severe call 911 IMMEDIATELY. If it is mild, please call our office. If you take any of these medications: Glipizide/Metformin, Avandament, Glucavance, please do not take 48 hours after completing test unless otherwise instructed.  We will call to schedule your test 2-4 weeks out understanding that some insurance companies will need an authorization prior to the service being performed.   For non-scheduling related questions, please contact the cardiac imaging nurse navigator should you have any questions/concerns: Marchia Bond, Cardiac Imaging Nurse Navigator Gordy Clement, Cardiac Imaging Nurse Navigator [place] [facility]  Herbalist Dial: [phone]   For scheduling needs, including cancellations and rescheduling, please call [place], [phone].   Follow-Up: At [facility], you and your health needs are our priority.  As part of our continuing mission to provide you with exceptional heart care, we have created designated  Provider Care Teams.  These Care Teams include your primary Cardiologist (physician) and Advanced Practice Providers (APPs -  Physician Assistants and Nurse Practitioners) who all work together to provide you with the care you need, when you need it.  We recommend signing up for the patient portal called "MyChart".  Sign up information is provided on this After Visit Summary.  MyChart is used to connect with patients for Virtual Visits (Telemedicine).  Patients are able to view lab/test results, encounter notes, upcoming appointments, etc.  Non-urgent messages can be sent to your provider as well.   To learn more about what you can do with MyChart, go to [email or web address].    Your next appointment:   6 month(s)  The format for your next appointment:   In Person  Provider:   DR. Johney Frame   Important Information About Sugar

## 2022-04-26 ENCOUNTER — Telehealth: Payer: Self-pay | Admitting: *Deleted

## 2022-04-26 NOTE — Telephone Encounter (Signed)
-----   Message from Melony Overly sent at 04/25/2022  1:00 PM EDT ----- Regarding: ct heart Scheduled 05/15/22 at 11:00

## 2022-04-30 DIAGNOSIS — R252 Cramp and spasm: Secondary | ICD-10-CM | POA: Diagnosis not present

## 2022-04-30 DIAGNOSIS — L814 Other melanin hyperpigmentation: Secondary | ICD-10-CM | POA: Diagnosis not present

## 2022-05-01 ENCOUNTER — Encounter: Payer: Self-pay | Admitting: Cardiology

## 2022-05-01 ENCOUNTER — Other Ambulatory Visit: Payer: Medicare Other

## 2022-05-06 ENCOUNTER — Other Ambulatory Visit: Payer: Medicare Other

## 2022-05-06 DIAGNOSIS — Z79899 Other long term (current) drug therapy: Secondary | ICD-10-CM | POA: Diagnosis not present

## 2022-05-06 DIAGNOSIS — R0602 Shortness of breath: Secondary | ICD-10-CM | POA: Diagnosis not present

## 2022-05-06 DIAGNOSIS — E785 Hyperlipidemia, unspecified: Secondary | ICD-10-CM

## 2022-05-06 DIAGNOSIS — Z9221 Personal history of antineoplastic chemotherapy: Secondary | ICD-10-CM

## 2022-05-06 DIAGNOSIS — R072 Precordial pain: Secondary | ICD-10-CM | POA: Diagnosis not present

## 2022-05-06 LAB — BASIC METABOLIC PANEL
BUN/Creatinine Ratio: 23 (ref 12–28)
BUN: 18 mg/dL (ref 8–27)
CO2: 27 mmol/L (ref 20–29)
Calcium: 9.6 mg/dL (ref 8.7–10.3)
Chloride: 101 mmol/L (ref 96–106)
Creatinine, Ser: 0.78 mg/dL (ref 0.57–1.00)
Glucose: 93 mg/dL (ref 70–99)
Potassium: 3.5 mmol/L (ref 3.5–5.2)
Sodium: 140 mmol/L (ref 134–144)
eGFR: 82 mL/min/{1.73_m2} (ref >59)

## 2022-05-06 LAB — LIPID PANEL
Chol/HDL Ratio: 3.2 ratio (ref 0.0–4.4)
Cholesterol, Total: 212 mg/dL — ABNORMAL HIGH (ref 100–199)
HDL: 66 mg/dL (ref >39)
LDL Chol Calc (NIH): 134 mg/dL — ABNORMAL HIGH (ref 0–99)
Triglycerides: 66 mg/dL (ref 0–149)
VLDL Cholesterol Cal: 12 mg/dL (ref 5–40)

## 2022-05-07 ENCOUNTER — Telehealth: Payer: Self-pay

## 2022-05-07 DIAGNOSIS — E785 Hyperlipidemia, unspecified: Secondary | ICD-10-CM

## 2022-05-07 IMAGING — US US ABDOMEN LIMITED RUQ/ASCITES
1 series · 14 of 25 positions shown · non-contrast
Comparison: CT March 31, 2020.

CLINICAL DATA: Right upper quadrant pain with reported positive
Murphy's sign prior to pain meds.

EXAM:
ULTRASOUND ABDOMEN LIMITED RIGHT UPPER QUADRANT

[Series 1: us abdomen limited ruq/ascites · 14 of 44 slices shown]
[im 1/44]
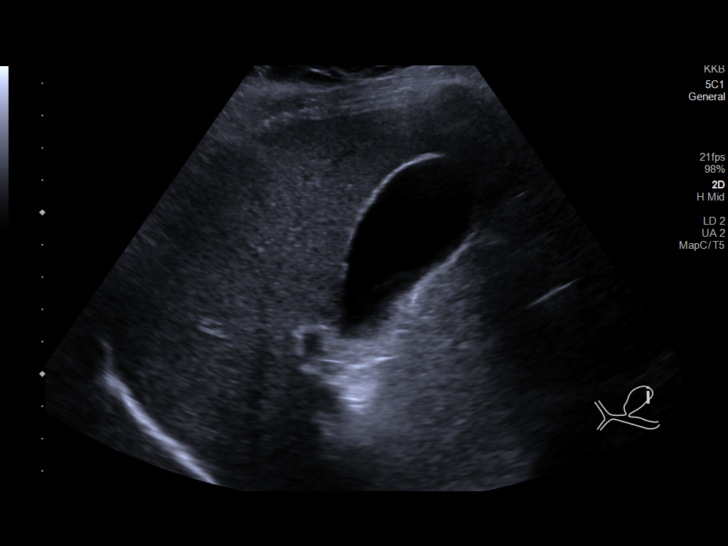
[im 4/44]
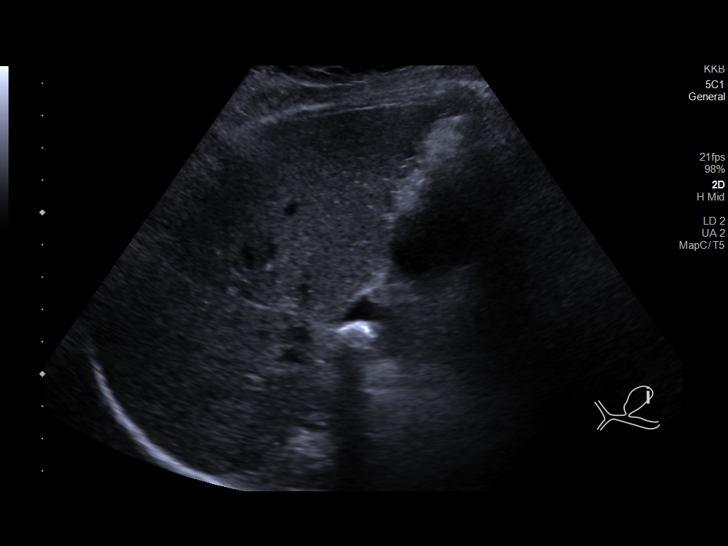
[im 8/44]
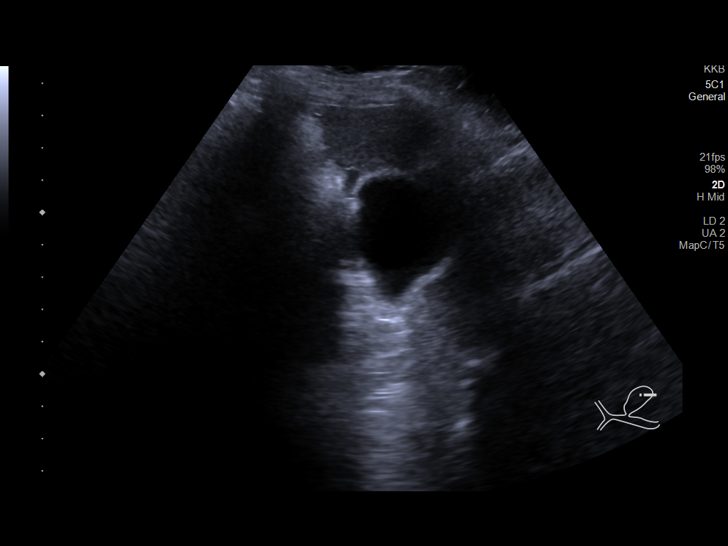
[im 11/44]
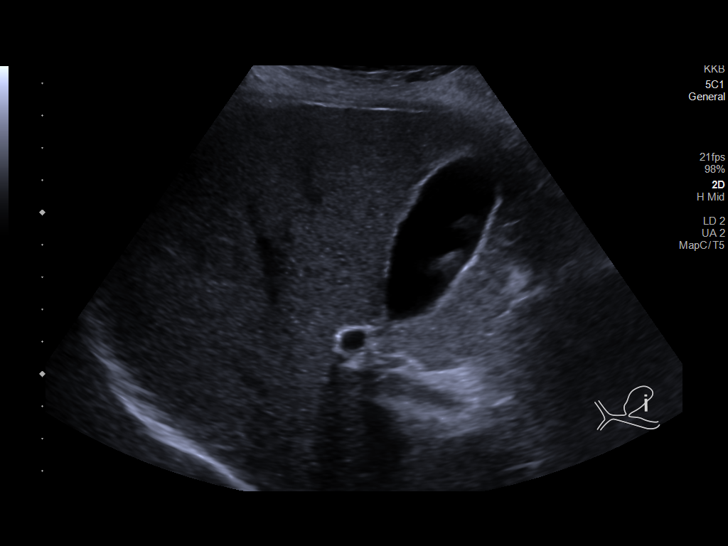
[im 15/44]
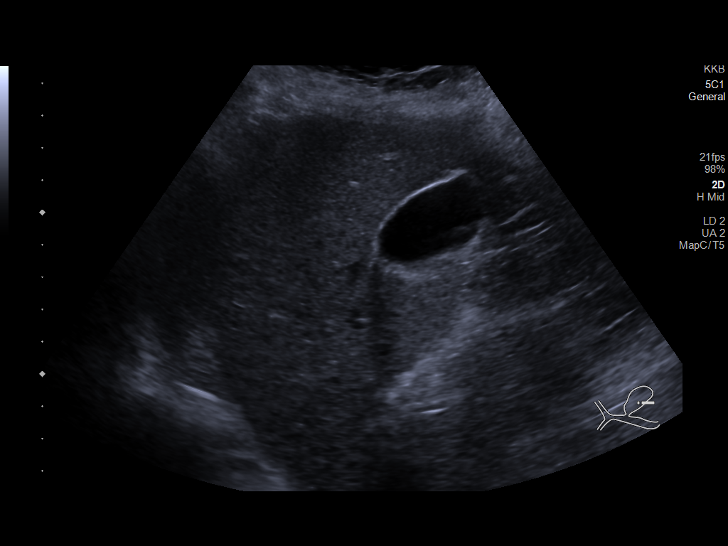
[im 17/44]
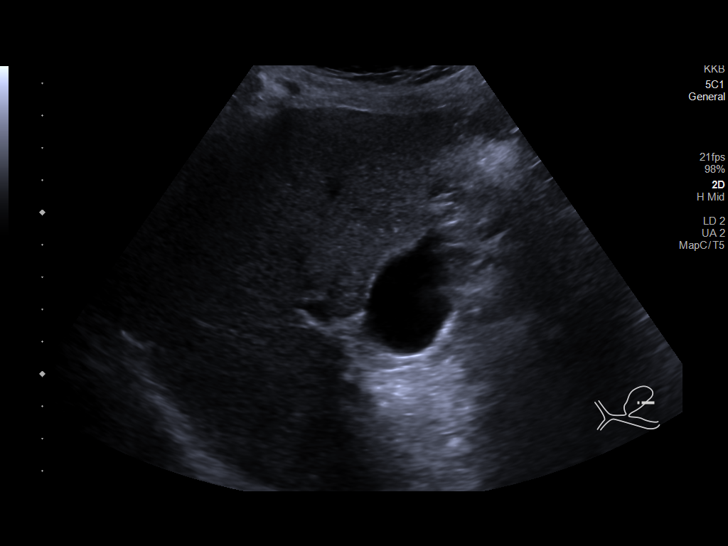
[im 20/44]
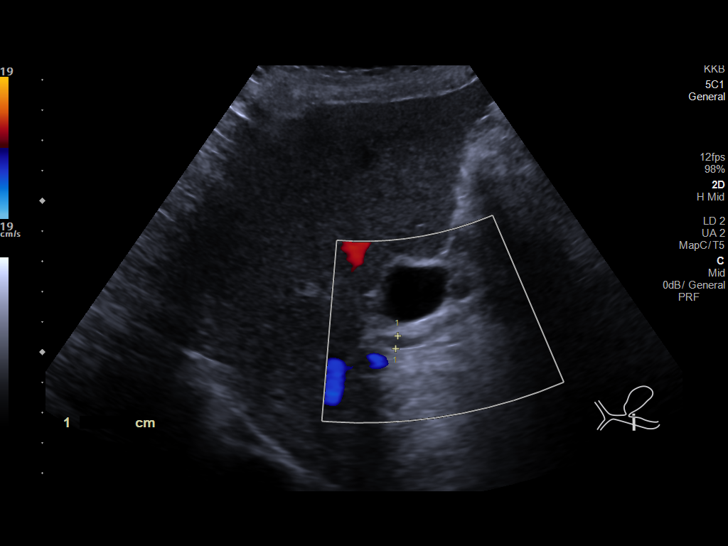
[im 24/44]
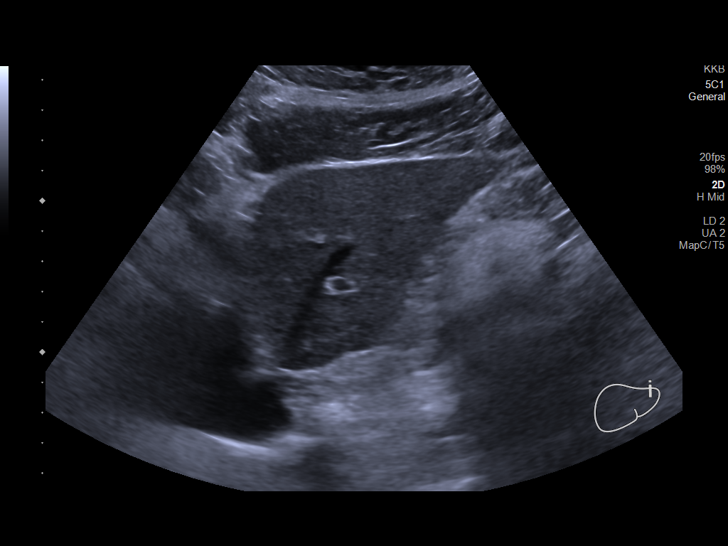
[im 27/44]
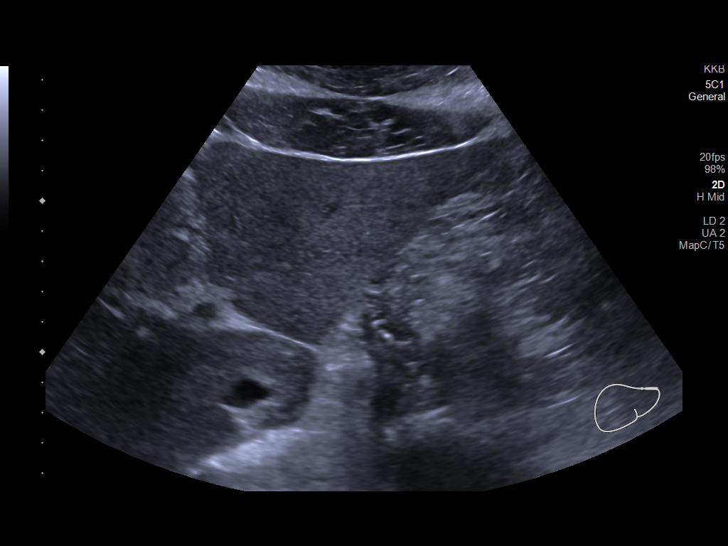
[im 29/44]
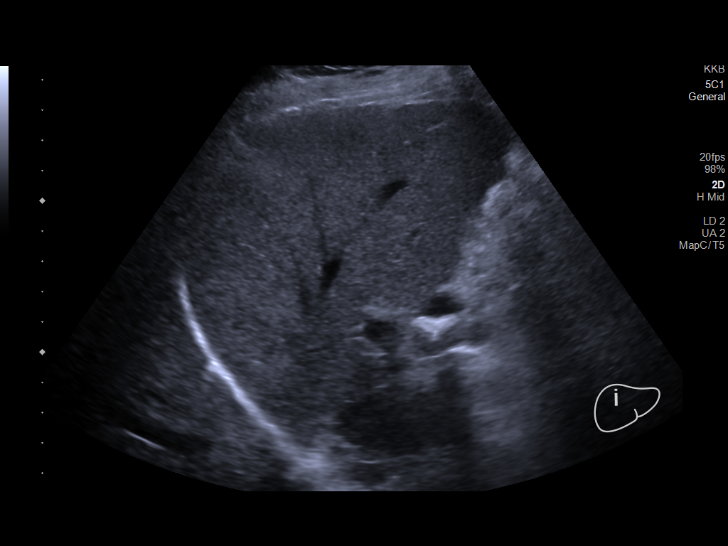
[im 33/44]
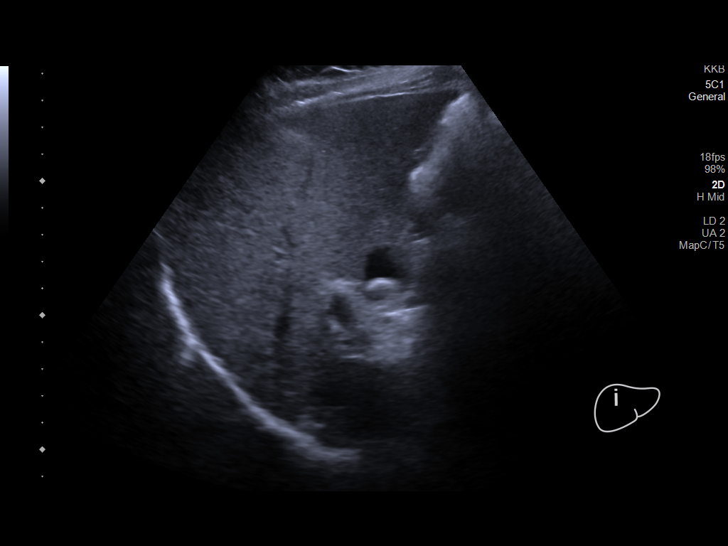
[im 36/44]
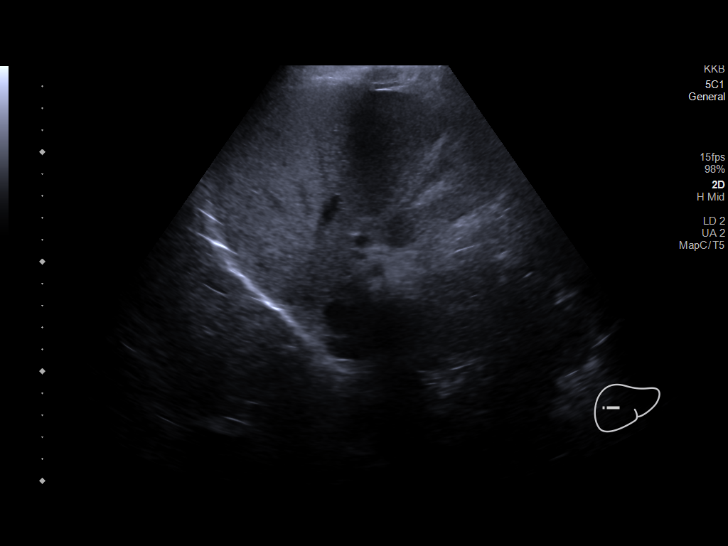
[im 40/44]
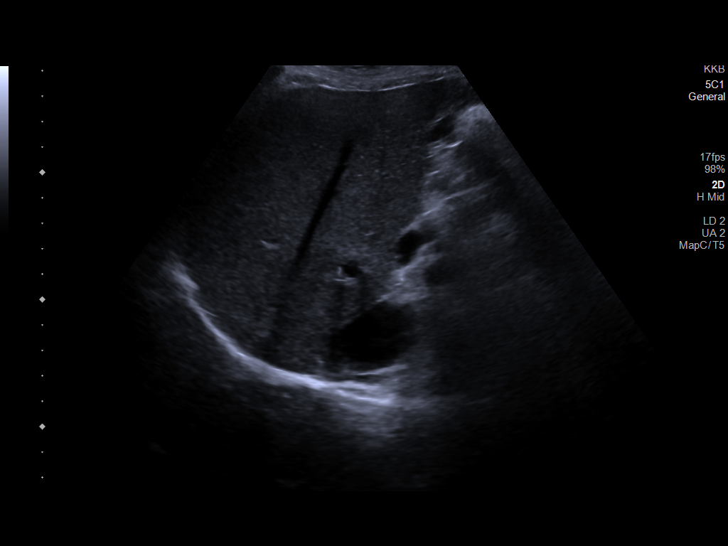
[im 44/44]
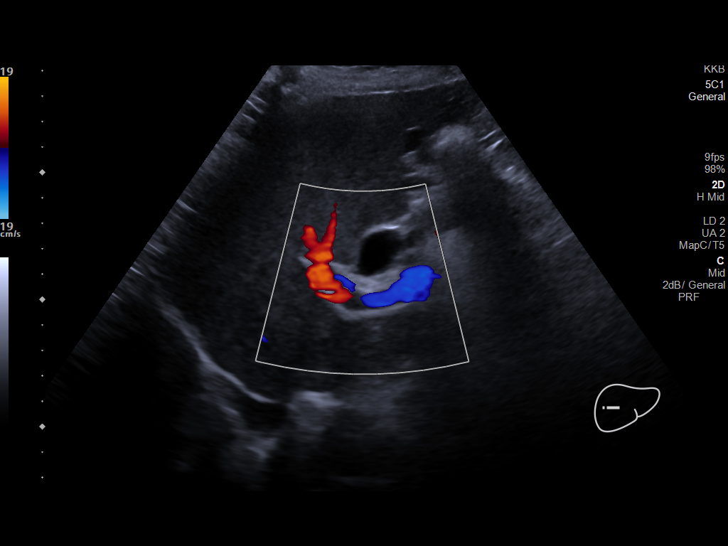

[14 of 25 positions shown; findings below may reference images not displayed]

FINDINGS: Gallbladder:

Cholelithiasis measuring up to 1.1 cm. No pericholecystic fluid or
wall thickening visualized. No sonographic Murphy sign noted by
sonographer.

Common bile duct:

Diameter: 4 mm

Liver:

No focal lesion identified. Within normal limits in parenchymal
echogenicity. Portal vein is patent on color Doppler imaging with
normal direction of blood flow towards the liver.

Other: None.
IMPRESSION: Cholelithiasis. No sonographic evidence of acute cholecystitis. If
continued clinical concern for cystic duct patency recommend further
evaluation with nuclear medicine HIDA scan.

## 2022-05-07 MED ORDER — ATORVASTATIN CALCIUM 40 MG PO TABS: 40.0000 mg | ORAL_TABLET | Freq: Every day | ORAL | 3 refills | Status: DC

## 2022-05-07 NOTE — Telephone Encounter (Signed)
Spoke with patient and provided lab results. Advised that Dr. Johney Frame wants to increase her Lipitor to '40mg'$  daily. Prescription sent to patient's pharmacy.  Patient needs to repeat lipids in 6-8 weeks and will call back to schedule closer to that time.

## 2022-05-07 NOTE — Telephone Encounter (Signed)
-----   Message from Freada Bergeron, MD sent at 05/06/2022  7:40 PM EDT ----- LDL is elevated at 134. Can we increase her lipitor to '40mg'$  daily and repeat lipids in 6-8 weeks?

## 2022-05-13 ENCOUNTER — Telehealth (HOSPITAL_COMMUNITY): Payer: Self-pay | Admitting: *Deleted

## 2022-05-13 NOTE — Telephone Encounter (Signed)
Reaching out to Penny Guzman to offer assistance regarding upcoming cardiac imaging study; pt verbalizes understanding of appt date/time, parking situation and where to check in, pre-test NPO status and medications ordered, and verified current allergies; name and call back number provided for further questions should they arise  Penny Clement RN Navigator Cardiac Imaging [facility] [phone] office [phone] cell  Penny Guzman to take '100mg'$  metoprolol tartrate two hours prior to her cardiac CT scan.  She is aware to arrive at 10:30am.

## 2022-05-15 ENCOUNTER — Ambulatory Visit (HOSPITAL_COMMUNITY)
Admission: RE | Admit: 2022-05-15 | Discharge: 2022-05-15 | Disposition: A | Discharge status: Home / Self Care | Payer: Medicare Other | Source: Ambulatory Visit | Attending: Cardiology | Admitting: Cardiology

## 2022-05-15 DIAGNOSIS — R072 Precordial pain: Secondary | ICD-10-CM | POA: Diagnosis not present

## 2022-05-15 DIAGNOSIS — Z9221 Personal history of antineoplastic chemotherapy: Secondary | ICD-10-CM | POA: Insufficient documentation

## 2022-05-15 DIAGNOSIS — R0602 Shortness of breath: Secondary | ICD-10-CM | POA: Insufficient documentation

## 2022-05-15 MED ORDER — NITROGLYCERIN 0.4 MG SL SUBL
SUBLINGUAL_TABLET | SUBLINGUAL | Status: AC
  Filled 2022-05-15: qty 2

## 2022-05-15 MED ORDER — NITROGLYCERIN 0.4 MG SL SUBL
0.8000 mg | SUBLINGUAL_TABLET | Freq: Once | SUBLINGUAL | Status: AC
  Administered 2022-05-15: 0.8 mg via SUBLINGUAL

## 2022-05-15 MED ORDER — IOHEXOL 350 MG/ML SOLN
100.0000 mL | Freq: Once | INTRAVENOUS | Status: AC | PRN
  Administered 2022-05-15: 100 mL via INTRAVENOUS

## 2022-05-16 ENCOUNTER — Telehealth: Payer: Self-pay | Admitting: *Deleted

## 2022-05-16 DIAGNOSIS — R931 Abnormal findings on diagnostic imaging of heart and coronary circulation: Secondary | ICD-10-CM

## 2022-05-16 DIAGNOSIS — Z79899 Other long term (current) drug therapy: Secondary | ICD-10-CM

## 2022-05-16 DIAGNOSIS — E785 Hyperlipidemia, unspecified: Secondary | ICD-10-CM

## 2022-05-16 DIAGNOSIS — I2510 Atherosclerotic heart disease of native coronary artery without angina pectoris: Secondary | ICD-10-CM

## 2022-05-16 NOTE — Telephone Encounter (Signed)
The patient has been notified of the result and verbalized understanding.  All questions (if any) were answered.  Pt states she would like her lipids rechecked in later Aug, since Dr. Johney Frame just increased her statin.  Ran this by Dr. Johney Frame and she is ok with this.  Scheduled the pt for repeat lipids on 8/21.  She is aware to come fasting to this lab appt.  Pt verbalized understanding and agrees with this plan.

## 2022-05-16 NOTE — Telephone Encounter (Signed)
-----   Message from Freada Bergeron, MD sent at 05/15/2022  7:53 PM EDT ----- Her CT scan shows mild plaque but no evidence of artery blockage. The Ca score is 231 (85%). This means we need to ensure her cholesterol is well controlled to prevent this plaque from worsening. We will see what her LDL on repeat labs. Her goal LDL is <70

## 2022-05-22 ENCOUNTER — Ambulatory Visit (HOSPITAL_COMMUNITY): Payer: Medicare Other | Attending: Cardiology

## 2022-05-22 DIAGNOSIS — Z9221 Personal history of antineoplastic chemotherapy: Secondary | ICD-10-CM | POA: Diagnosis not present

## 2022-05-22 DIAGNOSIS — R072 Precordial pain: Secondary | ICD-10-CM

## 2022-05-22 DIAGNOSIS — R0602 Shortness of breath: Secondary | ICD-10-CM | POA: Diagnosis not present

## 2022-05-22 LAB — ECHOCARDIOGRAM COMPLETE
Area-P 1/2: 2.43 cm2
S' Lateral: 2.9 cm

## 2022-05-23 ENCOUNTER — Telehealth: Payer: Self-pay | Admitting: *Deleted

## 2022-05-23 NOTE — Telephone Encounter (Signed)
Penny Alpha, LPN  3/72/9021  1:15 AM EDT     The patient has been notified of the result and verbalized understanding.  All questions (if any) were answered.   Pt states that testing was done to determine if she could possibly come off of aspirin, so she would like for Dr. Johney Frame to further advise on this.  Will route this result back to Dr. Johney Frame to further review and advise on ASA regimen.  Pt aware I will follow-up with her accordingly thereafter.    Freada Bergeron, MD  05/22/2022  8:05 PM EDT     Her echo shows normal pumping function and no significant valve disease. This is great news!     Freada Bergeron, MD  Penny Alpha, LPN She can come off the aspirin. The main goal is to lower her cholesterol to a goal LDL<70   Left the pt a detailed message as she requested, that per Dr. Johney Frame, she can now come off of ASA, and her main goal is to lower her cholesterol to a goal of LDL <70.  Left her a detailed message to call the office back with any additional questions or concerns about this.  Will also send her a mychart message with this information, for she requested that I do that as well.   Will discontinue ASA from her med list.

## 2022-05-28 ENCOUNTER — Encounter: Payer: Self-pay | Admitting: Cardiology

## 2022-05-30 DIAGNOSIS — M5416 Radiculopathy, lumbar region: Secondary | ICD-10-CM | POA: Diagnosis not present

## 2022-06-20 DIAGNOSIS — G4700 Insomnia, unspecified: Secondary | ICD-10-CM | POA: Diagnosis not present

## 2022-07-22 ENCOUNTER — Other Ambulatory Visit: Payer: Medicare Other

## 2022-08-02 ENCOUNTER — Other Ambulatory Visit: Payer: Medicare Other

## 2022-08-16 DIAGNOSIS — M65832 Other synovitis and tenosynovitis, left forearm: Secondary | ICD-10-CM | POA: Diagnosis not present

## 2022-08-28 DIAGNOSIS — M7062 Trochanteric bursitis, left hip: Secondary | ICD-10-CM | POA: Diagnosis not present

## 2022-08-28 DIAGNOSIS — M5416 Radiculopathy, lumbar region: Secondary | ICD-10-CM | POA: Diagnosis not present

## 2022-08-28 DIAGNOSIS — G894 Chronic pain syndrome: Secondary | ICD-10-CM | POA: Diagnosis not present

## 2022-09-24 DIAGNOSIS — M5416 Radiculopathy, lumbar region: Secondary | ICD-10-CM | POA: Diagnosis not present

## 2022-10-29 NOTE — Progress Notes (Deleted)
Cardiology Office Note:    Date:  10/29/2022   ID:  Penny Guzman, DOB [birth year], MRN [id number]  PCP:  Orpah Melter, MD   [facility] Providers Cardiologist:  None {   Referring MD: Orpah Melter, MD     History of Present Illness:    Penny Guzman is a 71 y.o. female with a hx of HTN, anal cancer (Stage IIIC) s/p XRT and treatment with 5-FU and mitomycin, aortic atherosclerosis and GERD who presents to clinic for follow-up.  Patient was initially seen in 04/24/22 to establish care for preventative measures given family history of CAD. At that visit, she reported decreased exercise capacity and fatigue.  Patient was seen by Carolee Rota, NP in 01/2022. Note reviewed. Patient requested to establish care with a Cardiologist for prevention measures given long-standing history of HTN, family history of CAD, and fatigue. CTA coronary 05/2022 with mild disease. Ca score 231 (85%).  TTE 05/2022 with LVEF 55-60%, mild LVH, normal GLS -18.9%, normal RV, no significant valve disease. She was recommended for continued medical management  Today, ***   Past Medical History:  Diagnosis Date   Colon cancer ([place]) 03/2016   anal cancer   Depression    GERD (gastroesophageal reflux disease)    Hypertension    Spinal stenosis     Past Surgical History:  Procedure Laterality Date   ovarian cysts      Current Medications: No outpatient medications have been marked as taking for the 10/30/22 encounter (Appointment) with Freada Bergeron, MD.     Allergies:   Codeine, Propoxyphene, and Triamcinolone   Social History   Socioeconomic History   Marital status: Married    Spouse name: Not on file   Number of children: Not on file   Years of education: Not on file   Highest education level: Not on file  Occupational History   Occupation: Teacher    Comment: Retired  Tobacco Use   Smoking status: Never   Smokeless tobacco: Never  Substance and Sexual Activity    Alcohol use: Yes    Comment: occ   Drug use: Never   Sexual activity: Not on file  Other Topics Concern   Not on file  Social History Narrative   Not on file   Social Determinants of Health   Financial Resource Strain: Not on file  Food Insecurity: Not on file  Transportation Needs: Not on file  Physical Activity: Not on file  Stress: Not on file  Social Connections: Not on file     Family History: The patient's family history includes Cancer in her daughter, father, and mother.  ROS:   Review of Systems  Constitutional:  Positive for malaise/fatigue. Negative for chills and fever.  HENT:  Negative for hearing loss and nosebleeds.   Eyes:  Negative for double vision.  Respiratory:  Positive for shortness of breath. Negative for cough and sputum production.   Cardiovascular:  Positive for chest pain (Pressure). Negative for palpitations, orthopnea, claudication, leg swelling and PND.  Gastrointestinal:  Positive for heartburn and nausea. Negative for vomiting.  Genitourinary:  Negative for hematuria.  Musculoskeletal:  Positive for myalgias. Negative for falls.  Neurological:  Negative for loss of consciousness and weakness.  Endo/Heme/Allergies:  Negative for polydipsia.  Psychiatric/Behavioral:  Negative for depression and suicidal ideas.      EKGs/Labs/Other Studies Reviewed:    The following studies were reviewed today: Coronary CTA 05/2022: PROTOCOL: A 100 kV prospective scan was triggered in the  descending thoracic aorta at 111 HU's. Axial non-contrast 3 mm slices were carried out through the heart. The data set was analyzed on a dedicated work station and scored using the Agatston method. Gantry rotation speed was 250 msecs and collimation was .6 mm. Heart rate was optimized medically and sl NTG was given. The 3D data set was reconstructed in 5% intervals of the 35-65 % of the R-R cycle. Systolic and diastolic phases were analyzed on a dedicated work station  using MPR, MIP and VRT modes. The patient received 196m OMNIPAQUE IOHEXOL 350 MG/ML SOLN of contrast.   FINDINGS: Coronary calcium score: The patient's coronary artery calcium score is 231, which places the patient in the 85th percentile.   Coronary arteries: Normal coronary origins.  Left dominance.   Right Coronary Artery: Small caliber vessel. No significant plaque or stenosis.   Left Main Coronary Artery: Normal caliber vessel. There is calcified plaque with 1-10% stenosis.   Left Anterior Descending Coronary Artery: Normal caliber vessel. There is focal mixed calcified and noncalcified plaque in the proximal LAD with 25-49% stenosis. Gives rise to 3 small diagonal branches. Distal LAD wraps apex.   Left Circumflex Artery: Large caliber vessel, gives rise to PDA. There is focal noncalcified plaque in the proximal LCx with 1-24% stenosis. Gives rise to large first and large second OM branches. There is focal 1-24% stenosis in the proximal OM2.   Aorta: Normal size, 29 mm at the mid ascending aorta (level of the PA bifurcation) measured double oblique. Aortic atherosclerosis. No dissection seen in visualized portions of the aorta.   Aortic Valve: Trivial calcifications. Trileaflet.   Other findings:   Normal pulmonary vein drainage into the left atrium.   Normal left atrial appendage without a thrombus.   Normal size of the pulmonary artery.   Normal appearance of the pericardium.   IMPRESSION: 1. Mild nonobstructive CADRADS = 2. CT FFR cannot be performed due to data quality, but visually no apparent severe stenosis noted.   2. Coronary calcium score of 231. This was 85th percentile for age and sex matched control.   3. Normal coronary origin with left dominance.  TTE 05/2022: IMPRESSIONS     1. Left ventricular ejection fraction, by estimation, is 55 to 60%. The  left ventricle has normal function. The left ventricle has no regional  wall motion  abnormalities. There is mild concentric left ventricular  hypertrophy. Left ventricular diastolic  parameters were normal. The average left ventricular global longitudinal  strain is -18.9 %. The global longitudinal strain is normal.   2. Right ventricular systolic function is normal. The right ventricular  size is normal. There is normal pulmonary artery systolic pressure.   3. The mitral valve is normal in structure. Trivial mitral valve  regurgitation. No evidence of mitral stenosis.   4. The aortic valve was not well visualized. Aortic valve regurgitation  is not visualized. No aortic stenosis is present.   5. The inferior vena cava is normal in size with greater than 50%  respiratory variability, suggesting right atrial pressure of 3 mmHg.   Comparison(s): No prior Echocardiogram.    EKG:  EKG is personally reviewed. 04/24/2022:  Sinus rhythm. Rate 66 bpm. Left axis devia . Low voltage. Poor R wave progression.    Recent Labs: 01/02/2022: ALT 29; Hemoglobin 13.5; Platelet Count 243 05/06/2022: BUN 18; Creatinine, Ser 0.78; Potassium 3.5; Sodium 140   Recent Lipid Panel    Component Value Date/Time   CHOL 212 (H) 05/06/2022 1038  TRIG 66 05/06/2022 1038   HDL 66 05/06/2022 1038   CHOLHDL 3.2 05/06/2022 1038   LDLCALC 134 (H) 05/06/2022 1038     Risk Assessment/Calculations:           Physical Exam:    VS:  There were no vitals taken for this visit.    Wt Readings from Last 3 Encounters:  04/24/22 151 lb (68.5 kg)  01/02/22 165 lb 2 oz (74.9 kg)  03/22/21 172 lb (78 kg)     GEN: Well nourished, well developed in no acute distress HEENT: Normal NECK: No JVD; No carotid bruits CARDIAC: RR, occasional skipped beats, no murmurs RESPIRATORY:  Clear to auscultation without rales, wheezing or rhonchi  ABDOMEN: Soft, non-tender, non-distended MUSCULOSKELETAL:  No edema; No deformity  SKIN: Warm and dry NEUROLOGIC:  Alert and oriented x 3 PSYCHIATRIC:  Normal affect    ASSESSMENT:    No diagnosis found.  PLAN:    In order of problems listed above:  #Mild Nonobstructive CAD: Coronary CTA with mild disease without evidence of severe stenosis. Ca score 231 (85%). Currently, *** -Continue lipitor '40mg'$  daily -Continue lifestyle modifications  #Aortic atherosclerosis: #HLD: -Continue lipitor '40mg'$  daily -Goal LDL <70; will repeat today  #HTN: Well controlled and at goal. -Continue losartan '25mg'$  daily        Follow-up:  6 months.  Medication Adjustments/Labs and Tests Ordered: Current medicines are reviewed at length with the patient today.  Concerns regarding medicines are outlined above.   No orders of the defined types were placed in this encounter.  No orders of the defined types were placed in this encounter.  There are no Patient Instructions on file for this visit.     Signed, Freada Bergeron, MD  10/29/2022 1:21 PM    [facility]

## 2022-10-30 ENCOUNTER — Ambulatory Visit: Payer: Medicare Other | Admitting: Cardiology

## 2022-10-30 DIAGNOSIS — F322 Major depressive disorder, single episode, severe without psychotic features: Secondary | ICD-10-CM | POA: Diagnosis not present

## 2022-10-30 DIAGNOSIS — G4700 Insomnia, unspecified: Secondary | ICD-10-CM | POA: Diagnosis not present

## 2022-10-30 DIAGNOSIS — I10 Essential (primary) hypertension: Secondary | ICD-10-CM | POA: Diagnosis not present

## 2022-10-30 DIAGNOSIS — Z0000 Encounter for general adult medical examination without abnormal findings: Secondary | ICD-10-CM | POA: Diagnosis not present

## 2022-12-04 DIAGNOSIS — M5416 Radiculopathy, lumbar region: Secondary | ICD-10-CM | POA: Diagnosis not present

## 2022-12-04 DIAGNOSIS — M791 Myalgia, unspecified site: Secondary | ICD-10-CM | POA: Diagnosis not present

## 2022-12-04 DIAGNOSIS — M7062 Trochanteric bursitis, left hip: Secondary | ICD-10-CM | POA: Diagnosis not present

## 2022-12-04 DIAGNOSIS — M5116 Intervertebral disc disorders with radiculopathy, lumbar region: Secondary | ICD-10-CM | POA: Diagnosis not present

## 2022-12-04 DIAGNOSIS — M546 Pain in thoracic spine: Secondary | ICD-10-CM | POA: Diagnosis not present

## 2022-12-04 DIAGNOSIS — M5136 Other intervertebral disc degeneration, lumbar region: Secondary | ICD-10-CM | POA: Diagnosis not present

## 2022-12-18 ENCOUNTER — Ambulatory Visit: Payer: Medicare Other | Admitting: Cardiology

## 2022-12-24 DIAGNOSIS — M5416 Radiculopathy, lumbar region: Secondary | ICD-10-CM | POA: Diagnosis not present

## 2022-12-29 NOTE — Progress Notes (Deleted)
Office Visit    Patient Name: Penny Guzman Date of Encounter: 12/29/2022  PCP:  Orpah Melter, MD   [facility]  Cardiologist:  Freada Bergeron, MD  Advanced Practice Provider:  No care team member to display Electrophysiologist:  None  {Press F2 to show EP APP, CHF, sleep or structural heart MD               :[id number]  { Click here to update then REFRESH NOTE - MD (PCP) or APP (Team Member)  Change PCP Type for MD, Specialty for APP is either Cardiology or Clinical Cardiac Electrophysiology  :[id number]  HPI    Penny Guzman is a 72 y.o. female with a past medical history significant for hypertension, anal cancer (stage IIIc) status post XRT and treatment with 5-FU and mitomycin, aortic atherosclerosis, and GERD resents today for follow-up appointment.  Family history significant for CHF and exertional fatigue.  Seen by Penny Rota, NP 01/2022.  Patient requested to establish care with cardiologist for preventative measures given longstanding history of hypertension, family history of CAD, and fatigue.  She was seen 04/24/2022 and endorsed decreased energy and exercise capacity over the past couple of years.  She was able to walk 10,000 steps per day but now is getting 6000 a day and felt more wiped out.  No associated chest pain but felt more fatigue than previously.  Continue to perform water aerobics and felt okay during those classes.  The day before her appointment, she had developed severe pain radiating proximally from her feet in both lower extremities.  Legs felt frozen and she felt nauseous.  Complained of tingling and weakness in her bilateral feet.  This was after she had gotten up from a daily nap to use the restroom and then went to lay down again.  Had no symptoms with exertion and the pain occurred during rest.  Additionally, she had associated chest pressure while laying down similar to a panic attack associated with dyspnea.  Prompts  her to get up quickly.  Does not have this sensation with exertion.  Rarely feels dizzy, when standing up after squatting or bending over.  Endorsed GERD treatment with omeprazole.  She was trying to restructure her regimen to avoid medications that would exacerbate her symptoms.  Acid reflux was triggered by heavy greasy foods but she tries to avoid.  Today, she***  Past Medical History    Past Medical History:  Diagnosis Date   Colon cancer ([place]) 03/2016   anal cancer   Depression    GERD (gastroesophageal reflux disease)    Hypertension    Spinal stenosis    Past Surgical History:  Procedure Laterality Date   ovarian cysts      Allergies  Allergies  Allergen Reactions   Codeine     1998-09-06;stomach nausea   Propoxyphene     1998-09-06;stomach nausea   Triamcinolone     2002-08-15;TOPICAL: RASH     EKGs/Labs/Other Studies Reviewed:   The following studies were reviewed today: CT Chest/Abdomen/Pelvis  03/31/2020: FINDINGS: CT CHEST FINDINGS   Cardiovascular: Coronary artery calcification and aortic atherosclerotic calcification.   Mediastinum/Nodes: No axillary or supraclavicular adenopathy. No mediastinal or hilar adenopathy. No pericardial effusion. Small hiatal hernia.   Lungs/Pleura: No suspicious pulmonary nodularity.  Airways normal.   Musculoskeletal: No aggressive osseous lesion.   CT ABDOMEN AND PELVIS FINDINGS   Hepatobiliary: No focal hepatic lesion.  Several gallstones noted.   Pancreas: Pancreas is normal.  No ductal dilatation. No pancreatic inflammation.   Spleen: Normal spleen   Adrenals/urinary tract: Adrenal glands and kidneys are normal. The ureters and bladder normal.   Stomach/Bowel: Stomach, small-bowel, cecum normal. Appendix not identified. Ascending, transverse and descending colon normal. Rectosigmoid colon normal. No mesenteric adenopathy. No perirectal adenopathy.   Vascular/Lymphatic: Abdominal aorta is normal caliber  with atherosclerotic calcification. There is no retroperitoneal or periportal lymphadenopathy. No pelvic lymphadenopathy. No inguinal adenopathy.   Reproductive: Uterus normal.  Adnexa unremarkable.   Other: No free fluid.   Musculoskeletal: No aggressive osseous lesion. Degenerative spurring of the lumbar spine. Endplate sclerosis.   IMPRESSION: Chest Impression:   1. No evidence of pulmonary metastasis. 2. Coronary artery calcification and Aortic Atherosclerosis (ICD10-I70.0).   Abdomen / Pelvis Impression:   1. No evidence local anal carcinoma recurrence. 2. No metastatic adenopathy or visceral metastasis. 3. Cholelithiasis without evidence cholecystitis. 4.  Aortic Atherosclerosis (ICD10-I70.0).  EKG:  EKG is *** ordered today.  The ekg ordered today demonstrates ***  Recent Labs: 01/02/2022: ALT 29; Hemoglobin 13.5; Platelet Count 243 05/06/2022: BUN 18; Creatinine, Ser 0.78; Potassium 3.5; Sodium 140  Recent Lipid Panel    Component Value Date/Time   CHOL 212 (H) 05/06/2022 1038   TRIG 66 05/06/2022 1038   HDL 66 05/06/2022 1038   CHOLHDL 3.2 05/06/2022 1038   LDLCALC 134 (H) 05/06/2022 1038    Risk Assessment/Calculations:  {Does this patient have ATRIAL FIBRILLATION?:[phone]}  Home Medications   No outpatient medications have been marked as taking for the 12/30/22 encounter (Appointment) with Elgie Collard, PA-C.     Review of Systems   ***   All other systems reviewed and are otherwise negative except as noted above.  Physical Exam    VS:  There were no vitals taken for this visit. , BMI There is no height or weight on file to calculate BMI.  Wt Readings from Last 3 Encounters:  04/24/22 151 lb (68.5 kg)  01/02/22 165 lb 2 oz (74.9 kg)  03/22/21 172 lb (78 kg)     GEN: Well nourished, well developed, in no acute distress. HEENT: normal. Neck: Supple, no JVD, carotid bruits, or masses. Cardiac: ***RRR, no murmurs, rubs, or gallops. No clubbing,  cyanosis, edema.  ***Radials/PT 2+ and equal bilaterally.  Respiratory:  ***Respirations regular and unlabored, clear to auscultation bilaterally. GI: Soft, nontender, nondistended. MS: No deformity or atrophy. Skin: Warm and dry, no rash. Neuro:  Strength and sensation are intact. Psych: Normal affect.  Assessment & Plan    Precordial pain SOB History of chemotherapy Hyperlipidemia Aortic atherosclerosis Coronary artery disease  No BP recorded.  {Refresh Note OR Click here to enter BP  :1}***      Disposition: Follow up {follow up:15908} with Freada Bergeron, MD or APP.  Signed, Elgie Collard, PA-C 12/29/2022, 6:15 PM [facility]

## 2022-12-30 ENCOUNTER — Ambulatory Visit: Payer: Medicare Other | Admitting: Physician Assistant

## 2022-12-30 DIAGNOSIS — Z9221 Personal history of antineoplastic chemotherapy: Secondary | ICD-10-CM

## 2022-12-30 DIAGNOSIS — Z79899 Other long term (current) drug therapy: Secondary | ICD-10-CM

## 2022-12-30 DIAGNOSIS — R0602 Shortness of breath: Secondary | ICD-10-CM

## 2022-12-30 DIAGNOSIS — R931 Abnormal findings on diagnostic imaging of heart and coronary circulation: Secondary | ICD-10-CM

## 2022-12-30 DIAGNOSIS — E785 Hyperlipidemia, unspecified: Secondary | ICD-10-CM

## 2022-12-30 DIAGNOSIS — I700 Atherosclerosis of aorta: Secondary | ICD-10-CM

## 2022-12-30 DIAGNOSIS — R072 Precordial pain: Secondary | ICD-10-CM

## 2022-12-30 DIAGNOSIS — I2510 Atherosclerotic heart disease of native coronary artery without angina pectoris: Secondary | ICD-10-CM

## 2022-12-31 DIAGNOSIS — Z20822 Contact with and (suspected) exposure to covid-19: Secondary | ICD-10-CM | POA: Diagnosis not present

## 2022-12-31 DIAGNOSIS — J069 Acute upper respiratory infection, unspecified: Secondary | ICD-10-CM | POA: Diagnosis not present

## 2023-01-22 ENCOUNTER — Other Ambulatory Visit: Payer: Self-pay | Admitting: Physician Assistant

## 2023-01-22 DIAGNOSIS — R1310 Dysphagia, unspecified: Secondary | ICD-10-CM | POA: Diagnosis not present

## 2023-01-22 DIAGNOSIS — K219 Gastro-esophageal reflux disease without esophagitis: Secondary | ICD-10-CM | POA: Diagnosis not present

## 2023-02-04 NOTE — Progress Notes (Unsigned)
Office Visit    Patient Name: Penny Guzman Date of Encounter: 02/05/2023  PCP:  Orpah Melter, MD   [facility]  Cardiologist:  Freada Bergeron, MD  Advanced Practice Provider:  No care team member to display Electrophysiologist:  None   HPI    Penny Guzman is a 72 y.o. female with a past medical history of HTN, anal cancer (stage IIIc) status post XRT and treatment with 5-FU and mitomycin, aortic atherosclerosis and GERD presents today for follow-up appointment.  She was last seen May 2023 and at that time she was having decreased energy and exercise capacity over the past couple of years.  She used to be able to walk 10,000 steps per day but now is getting about 6000 steps per day and felt wiped out.  No associated chest pain but did endorse more fatigued than previous.  Continue to perform water aerobics and felt okay during class.  The day prior to her appointment, she suddenly developed severe pain radiating proximally from her feet in both lower extremities.  Her legs felt frozen and she felt nauseous.  She also complained of tingling weakness in her bilateral feet.  This was after she got up from a daily nap to use the restroom and she lie down again.  No symptoms with exertion.  Pain occurred during rest.  Additionally, endorses occasional chest pressure while laying down similar to panic attacks associated with dyspnea.  Does not have this sensation with exertion.  Rarely feels dizzy when standing up after bending or squatting.  Does have GERD treated with omeprazole.  Family history of her mother with CHF who died when she was 11 years old and was a non-smoker.  Completed chemotherapy in 2017.  Today, she tells me that she has never had any chest pain or shortness of breath.  She has severe stenosis in her back which inhibits some of her physical activity.  She has no symptoms related to her heart.  No palpitations.  She was told that she has  aortic atherosclerosis and she does have a family history of CAD.  EKG stable today.  Reports no shortness of breath nor dyspnea on exertion. Reports no chest pain, pressure, or tightness. No edema, orthopnea, PND. Reports no palpitations.    Past Medical History    Past Medical History:  Diagnosis Date   Colon cancer ([place]) 03/2016   anal cancer   Depression    GERD (gastroesophageal reflux disease)    Hypertension    Spinal stenosis    Past Surgical History:  Procedure Laterality Date   ovarian cysts      Allergies  Allergies  Allergen Reactions   Codeine     1998-09-06;stomach nausea   Propoxyphene     1998-09-06;stomach nausea   Triamcinolone     2002-08-15;TOPICAL: RASH    EKGs/Labs/Other Studies Reviewed:   The following studies were reviewed today:   CT Chest/Abdomen/Pelvis  03/31/2020: FINDINGS: CT CHEST FINDINGS   Cardiovascular: Coronary artery calcification and aortic atherosclerotic calcification.   Mediastinum/Nodes: No axillary or supraclavicular adenopathy. No mediastinal or hilar adenopathy. No pericardial effusion. Small hiatal hernia.   Lungs/Pleura: No suspicious pulmonary nodularity.  Airways normal.   Musculoskeletal: No aggressive osseous lesion.   CT ABDOMEN AND PELVIS FINDINGS   Hepatobiliary: No focal hepatic lesion.  Several gallstones noted.   Pancreas: Pancreas is normal. No ductal dilatation. No pancreatic inflammation.   Spleen: Normal spleen   Adrenals/urinary tract: Adrenal  glands and kidneys are normal. The ureters and bladder normal.   Stomach/Bowel: Stomach, small-bowel, cecum normal. Appendix not identified. Ascending, transverse and descending colon normal. Rectosigmoid colon normal. No mesenteric adenopathy. No perirectal adenopathy.   Vascular/Lymphatic: Abdominal aorta is normal caliber with atherosclerotic calcification. There is no retroperitoneal or periportal lymphadenopathy. No pelvic lymphadenopathy.  No inguinal adenopathy.   Reproductive: Uterus normal.  Adnexa unremarkable.   Other: No free fluid.   Musculoskeletal: No aggressive osseous lesion. Degenerative spurring of the lumbar spine. Endplate sclerosis.   IMPRESSION: Chest Impression:   1. No evidence of pulmonary metastasis. 2. Coronary artery calcification and Aortic Atherosclerosis (ICD10-I70.0).   Abdomen / Pelvis Impression:   1. No evidence local anal carcinoma recurrence. 2. No metastatic adenopathy or visceral metastasis. 3. Cholelithiasis without evidence cholecystitis. 4.  Aortic Atherosclerosis (ICD10-I70.0).  EKG:  EKG is  ordered today.  The ekg ordered today demonstrates NSR 78bpm  Recent Labs: 05/06/2022: BUN 18; Creatinine, Ser 0.78; Potassium 3.5; Sodium 140  Recent Lipid Panel    Component Value Date/Time   CHOL 212 (H) 05/06/2022 1038   TRIG 66 05/06/2022 1038   HDL 66 05/06/2022 1038   CHOLHDL 3.2 05/06/2022 1038   LDLCALC 134 (H) 05/06/2022 1038     Home Medications   Current Meds  Medication Sig   augmented betamethasone dipropionate (DIPROLENE-AF) 0.05 % cream Apply 30 g topically as needed.   b complex vitamins tablet Take 1 tablet by mouth daily.   calcium-vitamin D (OSCAL WITH D) 500-200 MG-UNIT tablet Take 1 tablet by mouth.   co-enzyme Q-10 30 MG capsule Take 30 mg by mouth 3 (three) times daily.   diazepam (VALIUM) 5 MG tablet Take by mouth.   FLUoxetine (PROZAC) 10 MG capsule Take 10 mg by mouth daily.   FLUoxetine (PROZAC) 20 MG capsule Take 10 mg by mouth daily.   gabapentin (NEURONTIN) 300 MG capsule Take 300 mg by mouth daily.   HYDROcodone-acetaminophen (NORCO) 10-325 MG tablet Take 1 tablet by mouth 3 (three) times daily as needed.   ibuprofen (ADVIL) 200 MG tablet Take 200 mg by mouth daily. 250 mg daily   omeprazole (PRILOSEC) 10 MG capsule Take 10 mg by mouth daily.   Potassium 99 MG TABS Take 1 tablet by mouth daily.   traMADol (ULTRAM) 50 MG tablet Take 50 mg by  mouth 2 (two) times daily as needed.   zolpidem (AMBIEN) 5 MG tablet Take 2.5-5 mg by mouth at bedtime as needed for sleep.   [DISCONTINUED] atorvastatin (LIPITOR) 40 MG tablet Take 1 tablet (40 mg total) by mouth daily.   [DISCONTINUED] gabapentin (NEURONTIN) 100 MG capsule Take 200 mg by mouth 2 (two) times daily.   [DISCONTINUED] losartan (COZAAR) 25 MG tablet losartan 25 mg tablet  TAKE 1 TABLET BY MOUTH EVERY DAY   [DISCONTINUED] temazepam (RESTORIL) 15 MG capsule Take 15 mg by mouth at bedtime as needed.     Review of Systems      All other systems reviewed and are otherwise negative except as noted above.  Physical Exam    VS:  BP (!) 126/90   Pulse 78   Ht '5\' 5"'$  (1.651 m)   Wt 160 lb 9.6 oz (72.8 kg)   SpO2 98%   BMI 26.73 kg/m  , BMI Body mass index is 26.73 kg/m.  Wt Readings from Last 3 Encounters:  02/05/23 160 lb 9.6 oz (72.8 kg)  04/24/22 151 lb (68.5 kg)  01/02/22 165 lb 2 oz (  74.9 kg)     GEN: Well nourished, well developed, in no acute distress. HEENT: normal. Neck: Supple, no JVD, carotid bruits, or masses. Cardiac: RRR, no murmurs, rubs, or gallops. No clubbing, cyanosis, edema.  Radials/PT 2+ and equal bilaterally.  Respiratory:  Respirations regular and unlabored, clear to auscultation bilaterally. GI: Soft, nontender, nondistended. MS: No deformity or atrophy. Skin: Warm and dry, no rash. Neuro:  Strength and sensation are intact. Psych: Normal affect.  Assessment & Plan    Precordial pain -resolved, per patient never had chest "pressure" -GERD symptoms well controlled on prilosec -Continue current medications which include atorvastatin 40 mg daily and losartan 25 mg daily  Shortness of breath -resolved ever an  issue per the patient  History of chemotherapy -stable  Hyperlipidemia/aortic atherosclerosis -lipid panel by PCP -triglycerides 66 -continue Lipitor '40mg'$  daily   CAD -asa stopped by Dr. Johney Frame -LDL above goal, would  recommend follow-up lipid panel by PCP     Disposition: Follow up 1 year  with Freada Bergeron, MD or APP.  Signed, Elgie Collard, PA-C 02/05/2023, 12:26 PM [facility]

## 2023-02-05 ENCOUNTER — Encounter: Payer: Self-pay | Admitting: Physician Assistant

## 2023-02-05 ENCOUNTER — Ambulatory Visit: Payer: Medicare Other | Attending: Cardiology | Admitting: Physician Assistant

## 2023-02-05 VITALS — BP 126/90 | HR 78 | Ht 65.0 in | Wt 160.6 lb

## 2023-02-05 DIAGNOSIS — I700 Atherosclerosis of aorta: Secondary | ICD-10-CM

## 2023-02-05 DIAGNOSIS — R072 Precordial pain: Secondary | ICD-10-CM | POA: Diagnosis not present

## 2023-02-05 DIAGNOSIS — Z79899 Other long term (current) drug therapy: Secondary | ICD-10-CM

## 2023-02-05 DIAGNOSIS — Z9221 Personal history of antineoplastic chemotherapy: Secondary | ICD-10-CM | POA: Diagnosis not present

## 2023-02-05 DIAGNOSIS — I2584 Coronary atherosclerosis due to calcified coronary lesion: Secondary | ICD-10-CM

## 2023-02-05 DIAGNOSIS — I2510 Atherosclerotic heart disease of native coronary artery without angina pectoris: Secondary | ICD-10-CM

## 2023-02-05 DIAGNOSIS — R0602 Shortness of breath: Secondary | ICD-10-CM

## 2023-02-05 DIAGNOSIS — E785 Hyperlipidemia, unspecified: Secondary | ICD-10-CM

## 2023-02-05 MED ORDER — ATORVASTATIN CALCIUM 40 MG PO TABS: 40.0000 mg | ORAL_TABLET | Freq: Every day | ORAL | 3 refills | Status: DC

## 2023-02-05 MED ORDER — LOSARTAN POTASSIUM 25 MG PO TABS: 25.0000 mg | ORAL_TABLET | Freq: Every day | ORAL | 3 refills | Status: DC

## 2023-02-05 NOTE — Patient Instructions (Addendum)
Medication Instructions:   Your physician recommends that you continue on your current medications as directed. Please refer to the Current Medication list given to you today.   *If you need a refill on your cardiac medications before your next appointment, please call your pharmacy*   Lab Work:  REQUEST LABS FROM PRIMARY DOCTOR  LIVER/CHOLESTEROL    FAX RESULTS TO [place] [facility]   If you have labs (blood work) drawn today and your tests are completely normal, you will receive your results only by: [place] (if you have MyChart) OR A paper copy in the mail If you have any lab test that is abnormal or we need to change your treatment, we will call you to review the results.   Testing/Procedures: NONE ORDERED  TODAY     Follow-Up: At [facility], you and your health needs are our priority.  As part of our continuing mission to provide you with exceptional heart care, we have created designated Provider Care Teams.  These Care Teams include your primary Cardiologist (physician) and Advanced Practice Providers (APPs -  Physician Assistants and Nurse Practitioners) who all work together to provide you with the care you need, when you need it.  We recommend signing up for the patient portal called "MyChart".  Sign up information is provided on this After Visit Summary.  MyChart is used to connect with patients for Virtual Visits (Telemedicine).  Patients are able to view lab/test results, encounter notes, upcoming appointments, etc.  Non-urgent messages can be sent to your provider as well.   To learn more about what you can do with MyChart, go to [email or web address].    Your next appointment:   1 year(s)  Provider:   Freada Bergeron, MD    Other Instructions

## 2023-02-07 DIAGNOSIS — I700 Atherosclerosis of aorta: Secondary | ICD-10-CM | POA: Diagnosis not present

## 2023-02-07 DIAGNOSIS — R350 Frequency of micturition: Secondary | ICD-10-CM | POA: Diagnosis not present

## 2023-02-07 DIAGNOSIS — I10 Essential (primary) hypertension: Secondary | ICD-10-CM | POA: Diagnosis not present

## 2023-02-07 DIAGNOSIS — L989 Disorder of the skin and subcutaneous tissue, unspecified: Secondary | ICD-10-CM | POA: Diagnosis not present

## 2023-03-05 DIAGNOSIS — N952 Postmenopausal atrophic vaginitis: Secondary | ICD-10-CM | POA: Diagnosis not present

## 2023-03-05 DIAGNOSIS — L900 Lichen sclerosus et atrophicus: Secondary | ICD-10-CM | POA: Diagnosis not present

## 2023-03-05 DIAGNOSIS — Z85048 Personal history of other malignant neoplasm of rectum, rectosigmoid junction, and anus: Secondary | ICD-10-CM | POA: Diagnosis not present

## 2023-03-21 ENCOUNTER — Encounter (HOSPITAL_COMMUNITY): Payer: Self-pay

## 2023-03-21 ENCOUNTER — Other Ambulatory Visit (HOSPITAL_COMMUNITY): Payer: Medicare Other

## 2023-03-26 DIAGNOSIS — R3915 Urgency of urination: Secondary | ICD-10-CM | POA: Diagnosis not present

## 2023-03-26 DIAGNOSIS — R351 Nocturia: Secondary | ICD-10-CM | POA: Diagnosis not present

## 2023-04-14 DIAGNOSIS — M7062 Trochanteric bursitis, left hip: Secondary | ICD-10-CM | POA: Diagnosis not present

## 2023-04-30 DIAGNOSIS — M542 Cervicalgia: Secondary | ICD-10-CM | POA: Diagnosis not present

## 2023-04-30 DIAGNOSIS — Z5181 Encounter for therapeutic drug level monitoring: Secondary | ICD-10-CM | POA: Diagnosis not present

## 2023-04-30 DIAGNOSIS — M5416 Radiculopathy, lumbar region: Secondary | ICD-10-CM | POA: Diagnosis not present

## 2023-04-30 DIAGNOSIS — Z79899 Other long term (current) drug therapy: Secondary | ICD-10-CM | POA: Diagnosis not present

## 2023-04-30 DIAGNOSIS — M791 Myalgia, unspecified site: Secondary | ICD-10-CM | POA: Diagnosis not present

## 2023-05-29 DIAGNOSIS — R7303 Prediabetes: Secondary | ICD-10-CM | POA: Diagnosis not present

## 2023-05-29 DIAGNOSIS — I10 Essential (primary) hypertension: Secondary | ICD-10-CM | POA: Diagnosis not present

## 2023-05-29 DIAGNOSIS — I700 Atherosclerosis of aorta: Secondary | ICD-10-CM | POA: Diagnosis not present

## 2023-06-02 DIAGNOSIS — E7800 Pure hypercholesterolemia, unspecified: Secondary | ICD-10-CM | POA: Diagnosis not present

## 2023-06-02 DIAGNOSIS — I10 Essential (primary) hypertension: Secondary | ICD-10-CM | POA: Diagnosis not present

## 2023-06-03 DIAGNOSIS — R351 Nocturia: Secondary | ICD-10-CM | POA: Diagnosis not present

## 2023-06-03 DIAGNOSIS — R3915 Urgency of urination: Secondary | ICD-10-CM | POA: Diagnosis not present

## 2023-06-11 DIAGNOSIS — M5136 Other intervertebral disc degeneration, lumbar region: Secondary | ICD-10-CM | POA: Diagnosis not present

## 2023-06-12 DIAGNOSIS — L821 Other seborrheic keratosis: Secondary | ICD-10-CM | POA: Diagnosis not present

## 2023-06-12 DIAGNOSIS — R82998 Other abnormal findings in urine: Secondary | ICD-10-CM | POA: Diagnosis not present

## 2023-07-02 DIAGNOSIS — R3915 Urgency of urination: Secondary | ICD-10-CM | POA: Diagnosis not present

## 2023-07-17 DIAGNOSIS — R3915 Urgency of urination: Secondary | ICD-10-CM | POA: Diagnosis not present

## 2023-07-17 DIAGNOSIS — N312 Flaccid neuropathic bladder, not elsewhere classified: Secondary | ICD-10-CM | POA: Diagnosis not present

## 2023-07-18 DIAGNOSIS — H2513 Age-related nuclear cataract, bilateral: Secondary | ICD-10-CM | POA: Diagnosis not present

## 2023-07-18 DIAGNOSIS — H524 Presbyopia: Secondary | ICD-10-CM | POA: Diagnosis not present

## 2023-07-18 DIAGNOSIS — H52223 Regular astigmatism, bilateral: Secondary | ICD-10-CM | POA: Diagnosis not present

## 2023-07-18 DIAGNOSIS — H5213 Myopia, bilateral: Secondary | ICD-10-CM | POA: Diagnosis not present

## 2023-07-23 DIAGNOSIS — L989 Disorder of the skin and subcutaneous tissue, unspecified: Secondary | ICD-10-CM | POA: Diagnosis not present

## 2023-07-31 DIAGNOSIS — M5416 Radiculopathy, lumbar region: Secondary | ICD-10-CM | POA: Diagnosis not present

## 2023-09-01 DIAGNOSIS — M7062 Trochanteric bursitis, left hip: Secondary | ICD-10-CM | POA: Diagnosis not present

## 2023-09-01 DIAGNOSIS — M5416 Radiculopathy, lumbar region: Secondary | ICD-10-CM | POA: Diagnosis not present

## 2023-09-01 DIAGNOSIS — M5136 Other intervertebral disc degeneration, lumbar region: Secondary | ICD-10-CM | POA: Diagnosis not present

## 2023-09-01 DIAGNOSIS — M791 Myalgia, unspecified site: Secondary | ICD-10-CM | POA: Diagnosis not present

## 2023-09-01 DIAGNOSIS — G894 Chronic pain syndrome: Secondary | ICD-10-CM | POA: Diagnosis not present

## 2023-09-03 DIAGNOSIS — Z0000 Encounter for general adult medical examination without abnormal findings: Secondary | ICD-10-CM | POA: Diagnosis not present

## 2023-09-03 DIAGNOSIS — E7800 Pure hypercholesterolemia, unspecified: Secondary | ICD-10-CM | POA: Diagnosis not present

## 2023-09-03 DIAGNOSIS — Z23 Encounter for immunization: Secondary | ICD-10-CM | POA: Diagnosis not present

## 2023-09-03 DIAGNOSIS — I10 Essential (primary) hypertension: Secondary | ICD-10-CM | POA: Diagnosis not present

## 2023-11-12 DIAGNOSIS — R3915 Urgency of urination: Secondary | ICD-10-CM | POA: Diagnosis not present

## 2023-11-12 DIAGNOSIS — N312 Flaccid neuropathic bladder, not elsewhere classified: Secondary | ICD-10-CM | POA: Diagnosis not present

## 2023-11-17 DIAGNOSIS — R195 Other fecal abnormalities: Secondary | ICD-10-CM | POA: Diagnosis not present

## 2023-11-17 DIAGNOSIS — K219 Gastro-esophageal reflux disease without esophagitis: Secondary | ICD-10-CM | POA: Diagnosis not present

## 2023-11-17 DIAGNOSIS — R1310 Dysphagia, unspecified: Secondary | ICD-10-CM | POA: Diagnosis not present

## 2023-11-17 DIAGNOSIS — I2510 Atherosclerotic heart disease of native coronary artery without angina pectoris: Secondary | ICD-10-CM | POA: Diagnosis not present

## 2023-11-19 ENCOUNTER — Other Ambulatory Visit: Payer: Self-pay | Admitting: Physician Assistant

## 2023-12-24 DIAGNOSIS — E2839 Other primary ovarian failure: Secondary | ICD-10-CM | POA: Diagnosis not present

## 2023-12-24 DIAGNOSIS — Z780 Asymptomatic menopausal state: Secondary | ICD-10-CM | POA: Diagnosis not present

## 2023-12-24 DIAGNOSIS — Z1231 Encounter for screening mammogram for malignant neoplasm of breast: Secondary | ICD-10-CM | POA: Diagnosis not present

## 2023-12-24 DIAGNOSIS — M8588 Other specified disorders of bone density and structure, other site: Secondary | ICD-10-CM | POA: Diagnosis not present

## 2023-12-31 DIAGNOSIS — K209 Esophagitis, unspecified without bleeding: Secondary | ICD-10-CM | POA: Diagnosis not present

## 2023-12-31 DIAGNOSIS — K2289 Other specified disease of esophagus: Secondary | ICD-10-CM | POA: Diagnosis not present

## 2023-12-31 DIAGNOSIS — R195 Other fecal abnormalities: Secondary | ICD-10-CM | POA: Diagnosis not present

## 2023-12-31 DIAGNOSIS — Z85048 Personal history of other malignant neoplasm of rectum, rectosigmoid junction, and anus: Secondary | ICD-10-CM | POA: Diagnosis not present

## 2023-12-31 DIAGNOSIS — R1310 Dysphagia, unspecified: Secondary | ICD-10-CM | POA: Diagnosis not present

## 2023-12-31 DIAGNOSIS — K219 Gastro-esophageal reflux disease without esophagitis: Secondary | ICD-10-CM | POA: Diagnosis not present

## 2023-12-31 DIAGNOSIS — K319 Disease of stomach and duodenum, unspecified: Secondary | ICD-10-CM | POA: Diagnosis not present

## 2023-12-31 DIAGNOSIS — D128 Benign neoplasm of rectum: Secondary | ICD-10-CM | POA: Diagnosis not present

## 2023-12-31 DIAGNOSIS — D124 Benign neoplasm of descending colon: Secondary | ICD-10-CM | POA: Diagnosis not present

## 2024-01-05 DIAGNOSIS — M7918 Myalgia, other site: Secondary | ICD-10-CM | POA: Diagnosis not present

## 2024-01-05 DIAGNOSIS — M7062 Trochanteric bursitis, left hip: Secondary | ICD-10-CM | POA: Diagnosis not present

## 2024-01-05 DIAGNOSIS — G894 Chronic pain syndrome: Secondary | ICD-10-CM | POA: Diagnosis not present

## 2024-01-05 DIAGNOSIS — M5416 Radiculopathy, lumbar region: Secondary | ICD-10-CM | POA: Diagnosis not present

## 2024-01-07 DIAGNOSIS — D128 Benign neoplasm of rectum: Secondary | ICD-10-CM | POA: Diagnosis not present

## 2024-01-07 DIAGNOSIS — F322 Major depressive disorder, single episode, severe without psychotic features: Secondary | ICD-10-CM | POA: Diagnosis not present

## 2024-01-07 DIAGNOSIS — G8929 Other chronic pain: Secondary | ICD-10-CM | POA: Diagnosis not present

## 2024-01-07 DIAGNOSIS — D124 Benign neoplasm of descending colon: Secondary | ICD-10-CM | POA: Diagnosis not present

## 2024-01-07 DIAGNOSIS — K319 Disease of stomach and duodenum, unspecified: Secondary | ICD-10-CM | POA: Diagnosis not present

## 2024-01-07 DIAGNOSIS — K2289 Other specified disease of esophagus: Secondary | ICD-10-CM | POA: Diagnosis not present

## 2024-01-09 DIAGNOSIS — M25532 Pain in left wrist: Secondary | ICD-10-CM | POA: Diagnosis not present

## 2024-01-15 DIAGNOSIS — M2550 Pain in unspecified joint: Secondary | ICD-10-CM | POA: Diagnosis not present

## 2024-01-15 DIAGNOSIS — M549 Dorsalgia, unspecified: Secondary | ICD-10-CM | POA: Diagnosis not present

## 2024-01-15 DIAGNOSIS — M25532 Pain in left wrist: Secondary | ICD-10-CM | POA: Diagnosis not present

## 2024-01-15 DIAGNOSIS — M79642 Pain in left hand: Secondary | ICD-10-CM | POA: Diagnosis not present

## 2024-01-15 DIAGNOSIS — M79641 Pain in right hand: Secondary | ICD-10-CM | POA: Diagnosis not present

## 2024-01-15 DIAGNOSIS — M654 Radial styloid tenosynovitis [de Quervain]: Secondary | ICD-10-CM | POA: Diagnosis not present

## 2024-02-10 DIAGNOSIS — M654 Radial styloid tenosynovitis [de Quervain]: Secondary | ICD-10-CM | POA: Diagnosis not present

## 2024-02-18 ENCOUNTER — Other Ambulatory Visit: Payer: Self-pay | Admitting: Physician Assistant

## 2024-02-27 DIAGNOSIS — R350 Frequency of micturition: Secondary | ICD-10-CM | POA: Diagnosis not present

## 2024-02-27 DIAGNOSIS — R748 Abnormal levels of other serum enzymes: Secondary | ICD-10-CM | POA: Diagnosis not present

## 2024-03-10 DIAGNOSIS — N952 Postmenopausal atrophic vaginitis: Secondary | ICD-10-CM | POA: Diagnosis not present

## 2024-03-10 DIAGNOSIS — Z85048 Personal history of other malignant neoplasm of rectum, rectosigmoid junction, and anus: Secondary | ICD-10-CM | POA: Diagnosis not present

## 2024-03-10 DIAGNOSIS — L900 Lichen sclerosus et atrophicus: Secondary | ICD-10-CM | POA: Diagnosis not present

## 2024-03-10 DIAGNOSIS — Z01419 Encounter for gynecological examination (general) (routine) without abnormal findings: Secondary | ICD-10-CM | POA: Diagnosis not present

## 2024-03-19 ENCOUNTER — Other Ambulatory Visit: Payer: Self-pay | Admitting: Physician Assistant

## 2024-04-18 ENCOUNTER — Other Ambulatory Visit: Payer: Self-pay | Admitting: Cardiology

## 2024-04-28 DIAGNOSIS — M5416 Radiculopathy, lumbar region: Secondary | ICD-10-CM | POA: Diagnosis not present

## 2024-04-28 DIAGNOSIS — M51362 Other intervertebral disc degeneration, lumbar region with discogenic back pain and lower extremity pain: Secondary | ICD-10-CM | POA: Diagnosis not present

## 2024-04-28 DIAGNOSIS — G894 Chronic pain syndrome: Secondary | ICD-10-CM | POA: Diagnosis not present

## 2024-04-28 DIAGNOSIS — M791 Myalgia, unspecified site: Secondary | ICD-10-CM | POA: Diagnosis not present

## 2024-04-30 DIAGNOSIS — R748 Abnormal levels of other serum enzymes: Secondary | ICD-10-CM | POA: Diagnosis not present

## 2024-04-30 DIAGNOSIS — R7303 Prediabetes: Secondary | ICD-10-CM | POA: Diagnosis not present

## 2024-04-30 DIAGNOSIS — K8020 Calculus of gallbladder without cholecystitis without obstruction: Secondary | ICD-10-CM | POA: Diagnosis not present

## 2024-04-30 DIAGNOSIS — E559 Vitamin D deficiency, unspecified: Secondary | ICD-10-CM | POA: Diagnosis not present

## 2024-05-04 DIAGNOSIS — R300 Dysuria: Secondary | ICD-10-CM | POA: Diagnosis not present

## 2024-05-04 DIAGNOSIS — M654 Radial styloid tenosynovitis [de Quervain]: Secondary | ICD-10-CM | POA: Diagnosis not present

## 2024-05-10 DIAGNOSIS — E782 Mixed hyperlipidemia: Secondary | ICD-10-CM | POA: Diagnosis not present

## 2024-05-10 DIAGNOSIS — E559 Vitamin D deficiency, unspecified: Secondary | ICD-10-CM | POA: Diagnosis not present

## 2024-05-10 DIAGNOSIS — R110 Nausea: Secondary | ICD-10-CM | POA: Diagnosis not present

## 2024-05-10 DIAGNOSIS — F322 Major depressive disorder, single episode, severe without psychotic features: Secondary | ICD-10-CM | POA: Diagnosis not present

## 2024-05-18 ENCOUNTER — Other Ambulatory Visit: Payer: Self-pay | Admitting: Physician Assistant

## 2024-05-18 DIAGNOSIS — M5416 Radiculopathy, lumbar region: Secondary | ICD-10-CM | POA: Diagnosis not present

## 2024-05-24 ENCOUNTER — Other Ambulatory Visit: Payer: Self-pay

## 2024-05-24 MED ORDER — LOSARTAN POTASSIUM 25 MG PO TABS: 25.0000 mg | ORAL_TABLET | Freq: Every day | ORAL | 0 refills | Status: AC

## 2024-05-25 DIAGNOSIS — N390 Urinary tract infection, site not specified: Secondary | ICD-10-CM | POA: Diagnosis not present

## 2024-05-26 ENCOUNTER — Other Ambulatory Visit (HOSPITAL_BASED_OUTPATIENT_CLINIC_OR_DEPARTMENT_OTHER): Payer: Self-pay | Admitting: Family Medicine

## 2024-05-26 DIAGNOSIS — E782 Mixed hyperlipidemia: Secondary | ICD-10-CM

## 2024-05-26 DIAGNOSIS — M25532 Pain in left wrist: Secondary | ICD-10-CM | POA: Diagnosis not present

## 2024-06-10 DIAGNOSIS — M25532 Pain in left wrist: Secondary | ICD-10-CM | POA: Diagnosis not present

## 2024-06-25 ENCOUNTER — Other Ambulatory Visit: Payer: Self-pay | Admitting: Physician Assistant

## 2024-07-20 ENCOUNTER — Ambulatory Visit (HOSPITAL_BASED_OUTPATIENT_CLINIC_OR_DEPARTMENT_OTHER)
Admission: RE | Admit: 2024-07-20 | Discharge: 2024-07-20 | Disposition: A | Discharge status: Home / Self Care | Payer: Self-pay | Source: Ambulatory Visit | Attending: Family Medicine | Admitting: Family Medicine

## 2024-07-20 DIAGNOSIS — E782 Mixed hyperlipidemia: Secondary | ICD-10-CM | POA: Insufficient documentation

## 2024-08-09 ENCOUNTER — Ambulatory Visit: Admitting: Internal Medicine

## 2024-08-30 DIAGNOSIS — G894 Chronic pain syndrome: Secondary | ICD-10-CM | POA: Diagnosis not present

## 2024-08-30 DIAGNOSIS — Z5181 Encounter for therapeutic drug level monitoring: Secondary | ICD-10-CM | POA: Diagnosis not present

## 2024-08-30 DIAGNOSIS — Z79899 Other long term (current) drug therapy: Secondary | ICD-10-CM | POA: Diagnosis not present

## 2024-09-08 DIAGNOSIS — Z0000 Encounter for general adult medical examination without abnormal findings: Secondary | ICD-10-CM | POA: Diagnosis not present

## 2024-09-21 DIAGNOSIS — I10 Essential (primary) hypertension: Secondary | ICD-10-CM | POA: Diagnosis not present

## 2024-09-21 DIAGNOSIS — Z131 Encounter for screening for diabetes mellitus: Secondary | ICD-10-CM | POA: Diagnosis not present

## 2024-10-14 ENCOUNTER — Other Ambulatory Visit: Payer: Self-pay

## 2024-10-14 ENCOUNTER — Emergency Department (HOSPITAL_BASED_OUTPATIENT_CLINIC_OR_DEPARTMENT_OTHER)
Admission: EM | Admit: 2024-10-14 | Discharge: 2024-10-14 | Disposition: A | Discharge status: Home / Self Care | Attending: Emergency Medicine | Admitting: Emergency Medicine

## 2024-10-14 ENCOUNTER — Emergency Department (HOSPITAL_BASED_OUTPATIENT_CLINIC_OR_DEPARTMENT_OTHER): Admitting: Radiology

## 2024-10-14 ENCOUNTER — Emergency Department (HOSPITAL_BASED_OUTPATIENT_CLINIC_OR_DEPARTMENT_OTHER)

## 2024-10-14 DIAGNOSIS — K8020 Calculus of gallbladder without cholecystitis without obstruction: Secondary | ICD-10-CM | POA: Diagnosis not present

## 2024-10-14 DIAGNOSIS — R222 Localized swelling, mass and lump, trunk: Secondary | ICD-10-CM | POA: Diagnosis not present

## 2024-10-14 DIAGNOSIS — R109 Unspecified abdominal pain: Secondary | ICD-10-CM | POA: Insufficient documentation

## 2024-10-14 DIAGNOSIS — D171 Benign lipomatous neoplasm of skin and subcutaneous tissue of trunk: Secondary | ICD-10-CM | POA: Diagnosis not present

## 2024-10-14 DIAGNOSIS — K449 Diaphragmatic hernia without obstruction or gangrene: Secondary | ICD-10-CM | POA: Diagnosis not present

## 2024-10-14 DIAGNOSIS — I700 Atherosclerosis of aorta: Secondary | ICD-10-CM | POA: Diagnosis not present

## 2024-10-14 DIAGNOSIS — D179 Benign lipomatous neoplasm, unspecified: Secondary | ICD-10-CM | POA: Diagnosis not present

## 2024-10-14 DIAGNOSIS — R1110 Vomiting, unspecified: Secondary | ICD-10-CM | POA: Diagnosis not present

## 2024-10-14 LAB — URINALYSIS, ROUTINE W REFLEX MICROSCOPIC
Bilirubin Urine: NEGATIVE
Glucose, UA: NEGATIVE mg/dL
Hgb urine dipstick: NEGATIVE
Ketones, ur: NEGATIVE mg/dL
Leukocytes,Ua: NEGATIVE
Nitrite: NEGATIVE
Protein, ur: NEGATIVE mg/dL
Specific Gravity, Urine: >1.046 — ABNORMAL HIGH (ref 1.005–1.030)
pH: 6.5 (ref 5.0–8.0)

## 2024-10-14 LAB — COMPREHENSIVE METABOLIC PANEL WITH GFR
ALT: 31 U/L (ref 0–44)
AST: 28 U/L (ref 15–41)
Albumin: 3.9 g/dL (ref 3.5–5.0)
Alkaline Phosphatase: 130 U/L — ABNORMAL HIGH (ref 38–126)
Anion gap: 10 (ref 5–15)
BUN: 23 mg/dL (ref 8–23)
CO2: 24 mmol/L (ref 22–32)
Calcium: 9.8 mg/dL (ref 8.9–10.3)
Chloride: 105 mmol/L (ref 98–111)
Creatinine, Ser: 0.65 mg/dL (ref 0.44–1.00)
GFR, Estimated: >60 mL/min (ref >60)
Glucose, Bld: 142 mg/dL — ABNORMAL HIGH (ref 70–99)
Potassium: 3.9 mmol/L (ref 3.5–5.1)
Sodium: 140 mmol/L (ref 135–145)
Total Bilirubin: 0.6 mg/dL (ref 0.0–1.2)
Total Protein: 6.9 g/dL (ref 6.5–8.1)

## 2024-10-14 LAB — CBC WITH DIFFERENTIAL/PLATELET
Abs Immature Granulocytes: 0.02 K/uL (ref 0.00–0.07)
Basophils Absolute: 0 K/uL (ref 0.0–0.1)
Basophils Relative: 1 %
Eosinophils Absolute: 0.2 K/uL (ref 0.0–0.5)
Eosinophils Relative: 3 %
HCT: 42 % (ref 36.0–46.0)
Hemoglobin: 13.7 g/dL (ref 12.0–15.0)
Immature Granulocytes: 0 %
Lymphocytes Relative: 21 %
Lymphs Abs: 1.1 K/uL (ref 0.7–4.0)
MCH: 30.2 pg (ref 26.0–34.0)
MCHC: 32.6 g/dL (ref 30.0–36.0)
MCV: 92.5 fL (ref 80.0–100.0)
Monocytes Absolute: 0.4 K/uL (ref 0.1–1.0)
Monocytes Relative: 8 %
Neutro Abs: 3.6 K/uL (ref 1.7–7.7)
Neutrophils Relative %: 67 %
Platelets: 276 K/uL (ref 150–400)
RBC: 4.54 MIL/uL (ref 3.87–5.11)
RDW: 13.2 % (ref 11.5–15.5)
WBC: 5.3 K/uL (ref 4.0–10.5)
nRBC: 0 % (ref 0.0–0.2)

## 2024-10-14 MED ORDER — IOHEXOL 300 MG/ML  SOLN
100.0000 mL | Freq: Once | INTRAMUSCULAR | Status: AC | PRN
  Administered 2024-10-14: 100 mL via INTRAVENOUS

## 2024-10-14 NOTE — ED Triage Notes (Signed)
 Patient reports a right sided abdominal bulge she has noticed for a week but not it is getting increasingly more sensitive. Denies N/V/D and says she has hx of inguinal hernia and gallbladder problems.

## 2024-10-14 NOTE — Discharge Instructions (Addendum)
 It was a pleasure taking care of you today. You were seen in the Emergency Department for abdominal mass like substance. Your work-up was reassuring. Your CT was consistent with a benign lipoma on your abdomen.  There was also evidence of gallstones without infection.  There is no evidence of any infection in your abdomen.  Your urine is not infected.  I am giving you a referral to general surgery if you wish to have this lipoma removed.  You can call them to schedule an appointment at your earliest convenience.  Refer to the attached documentation for further management of your symptoms.   Please return to the ER if you experience chest pain, trouble breathing, intractable nausea/vomiting or any other life threatening illnesses.

## 2024-10-14 NOTE — ED Provider Notes (Signed)
 [place] EMERGENCY DEPARTMENT AT [facility] Provider Note   CSN: [id number] Arrival date & time: 10/14/24  1428     Patient presents with: Mass (Right abdomen )   Penny Guzman is a 73 y.o. female with past medical history of spinal stenosis, hyperlipidemia, chronic back pain who presents emergency department for evaluation of right-sided abdominal pain and mass.  Patient states she noticed a small mass on her right upper quadrant that is typically nontender to touch.  However, patient states over the last couple of days this mass has increased in size.  Today, while she was at a water aerobics class she was wearing a tight belt over her waist when she began to experience worsening acid reflux.  However, at the end of her class, the symptoms resolved.  At this time, her primary complaint is this large sided mass.  She states it is nontender.  She denies pain, redness to the area.  She does not have any nausea or vomiting.  No changes to her bowel movements.  No urinary symptoms.  Patient denies history of prior abdominal surgeries   HPI     Prior to Admission medications   Medication Sig Start Date End Date Taking? Authorizing Provider  atorvastatin  (LIPITOR) 40 MG tablet Take 1 tablet (40 mg total) by mouth daily. 02/05/23   Lucien Orren SAILOR, PA-C  augmented betamethasone  dipropionate (DIPROLENE -AF) 0.05 % cream Apply 30 g topically as needed. 02/02/20   [provider]  b complex vitamins tablet Take 1 tablet by mouth daily.    [provider]  calcium -vitamin D (OSCAL WITH D) 500-200 MG-UNIT tablet Take 1 tablet by mouth.    [provider]  co-enzyme Q-10 30 MG capsule Take 30 mg by mouth 3 (three) times daily.    [provider]  diazepam (VALIUM) 5 MG tablet Take by mouth. 07/24/20   [provider]  FLUoxetine (PROZAC) 10 MG capsule Take 10 mg by mouth daily. 02/06/20   [provider]  FLUoxetine (PROZAC) 20 MG capsule  Take 10 mg by mouth daily. 12/26/21   [provider]  gabapentin (NEURONTIN) 300 MG capsule Take 300 mg by mouth daily. 11/16/21   [provider]  HYDROcodone-acetaminophen (NORCO) 10-325 MG tablet Take 1 tablet by mouth 3 (three) times daily as needed. 11/30/21   [provider]  ibuprofen (ADVIL) 200 MG tablet Take 200 mg by mouth daily. 250 mg daily    [provider]  losartan  (COZAAR ) 25 MG tablet Take 1 tablet (25 mg total) by mouth daily. 05/24/24   Lucien Orren SAILOR, PA-C  omeprazole (PRILOSEC) 10 MG capsule Take 10 mg by mouth daily.    [provider]  omeprazole (PRILOSEC) 20 MG capsule Take 20 mg by mouth daily. 03/02/18 03/01/20  [provider]  Potassium 99 MG TABS Take 1 tablet by mouth daily.    [provider]  traMADol (ULTRAM) 50 MG tablet Take 50 mg by mouth 2 (two) times daily as needed. 12/28/21   [provider]  zolpidem (AMBIEN) 5 MG tablet Take 2.5-5 mg by mouth at bedtime as needed for sleep.    [provider]    Allergies: Codeine, Propoxyphene, and Triamcinolone     Review of Systems  Gastrointestinal:  Positive for abdominal pain.    Updated Vital Signs BP (!) 174/90 (BP Location: Left Arm)   Pulse 79   Temp (!) 97.5 F (36.4 C) (Oral)   Resp 18  SpO2 97%   Physical Exam Vitals and nursing note reviewed.  Constitutional:      Appearance: Normal appearance.  HENT:     Head: Normocephalic and atraumatic.     Mouth/Throat:     Mouth: Mucous membranes are moist.  Eyes:     General: No scleral icterus.       Right eye: No discharge.        Left eye: No discharge.     Conjunctiva/sclera: Conjunctivae normal.  Cardiovascular:     Rate and Rhythm: Normal rate and regular rhythm.     Pulses: Normal pulses.  Pulmonary:     Effort: Pulmonary effort is normal.     Breath sounds: Normal breath sounds.     Comments: Mild abdominal tenderness in the right upper quadrant to  palpation.  Negative Murphy sign.  There is a 2 cm lipoma between her RUQ and LUQ.  It is nontender to palpation. Abdominal:     General: There is no distension.     Tenderness: There is abdominal tenderness.  Musculoskeletal:        General: No deformity.     Cervical back: Normal range of motion.  Skin:    General: Skin is warm and dry.     Capillary Refill: Capillary refill takes less than 2 seconds.  Neurological:     Mental Status: She is alert.     Motor: No weakness.  Psychiatric:        Mood and Affect: Mood normal.     (all labs ordered are listed, but only abnormal results are displayed) Labs Reviewed  COMPREHENSIVE METABOLIC PANEL WITH GFR - Abnormal; Notable for the following components:      Result Value   Glucose, Bld 142 (*)    Alkaline Phosphatase 130 (*)    All other components within normal limits  URINALYSIS, ROUTINE W REFLEX MICROSCOPIC - Abnormal; Notable for the following components:   Color, Urine COLORLESS (*)    Specific Gravity, Urine >1.046 (*)    All other components within normal limits  CBC WITH DIFFERENTIAL/PLATELET    EKG: None  Radiology: DG Chest 2 View Result Date: 10/14/2024 EXAM: 2 VIEW(S) XRAY OF THE CHEST 10/14/2024 08:53:00 PM COMPARISON: None available. CLINICAL HISTORY: ams, emesis FINDINGS: LUNGS AND PLEURA: No focal pulmonary opacity. No pleural effusion. HEART AND MEDIASTINUM: Small hiatal hernia is noted. Aortic calcifications are seen. BONES AND SOFT TISSUES: No acute osseous abnormality. IMPRESSION: 1. No acute cardiopulmonary process. 2. Small hiatal hernia. Electronically signed by: Oneil Devonshire MD 10/14/2024 08:57 PM EST RP Workstation: [id number]   CT ABDOMEN PELVIS W CONTRAST Result Date: 10/14/2024 EXAM: CT ABDOMEN AND PELVIS WITH CONTRAST 10/14/2024 06:27:23 PM TECHNIQUE: CT of the abdomen and pelvis was performed with the administration of 100 mL of iohexol  (OMNIPAQUE ) 300 MG/ML solution. Multiplanar reformatted images  are provided for review. Automated exposure control, iterative reconstruction, and/or weight-based adjustment of the mA/kV was utilized to reduce the radiation dose to as low as reasonably achievable. COMPARISON: 03/31/2020 CLINICAL HISTORY: Abdominal pain, acute, nonlocalized; diffuse right abd pain, edema. FINDINGS: LOWER CHEST: Small hiatal hernia. LIVER: The liver is unremarkable. GALLBLADDER AND BILE DUCTS: Cholelithiasis without cholecystitis. No biliary ductal dilatation. SPLEEN: No acute abnormality. PANCREAS: No acute abnormality. ADRENAL GLANDS: No acute abnormality. KIDNEYS, URETERS AND BLADDER: No stones in the kidneys or ureters. No hydronephrosis. No perinephric or periureteral stranding. Urinary bladder is unremarkable. GI AND BOWEL: Stomach demonstrates no acute abnormality. There is no bowel obstruction. Normal  appendix. PERITONEUM AND RETROPERITONEUM: No ascites. No free air. VASCULATURE: Aorta is normal in caliber. Aortic atherosclerotic calcification. LYMPH NODES: No lymphadenopathy. REPRODUCTIVE ORGANS: No acute abnormality. BONES AND SOFT TISSUES: No acute osseous abnormality. No focal soft tissue abnormality. IMPRESSION: 1. No acute abnormality. 2. Cholelithiasis without cholecystitis. Electronically signed by: Norman Gatlin MD 10/14/2024 06:34 PM EST RP Workstation: [id number]    Procedures   Medications Ordered in the ED  iohexol  (OMNIPAQUE ) 300 MG/ML solution 100 mL (100 mLs Intravenous Contrast Given 10/14/24 1818)     Medical Decision Making Amount and/or Complexity of Data Reviewed Labs: ordered.   This patient presents to the ED for concern of abdominal mass, this involves an extensive number of treatment options, and is a complaint that carries with it a high risk of complications and morbidity.  Differential diagnosis includes: Lipoma, abdominal wall hernia, abscess, abdominal aortic aneurysm  Co morbidities:  history of spinal stenosis   Lab Tests:  I Ordered,  and personally interpreted labs.  The pertinent results include: No acute abnormality  Imaging Studies:  I ordered imaging studies including CT abdomen pelvis I independently visualized and interpreted imaging which showed cholelithiasis without cholecystitis I agree with the radiologist interpretation  Cardiac Monitoring/ECG:  The patient was maintained on a cardiac monitor.  I personally viewed and interpreted the cardiac monitored which showed an underlying rhythm of: Normal sinus rhythm  Medicines ordered and prescription drug management:  I ordered medication including  Medications  iohexol  (OMNIPAQUE ) 300 MG/ML solution 100 mL (100 mLs Intravenous Contrast Given 10/14/24 1818)   for contrast Reevaluation of the patient after these medicines showed that the patient   Test Considered:   none  Critical Interventions:   none  Consultations Obtained: None  Problem List / ED Course:     ICD-10-CM   1. Lipoma of torso  D17.1       MDM: 73 year old female who presents emergency department for evaluation of abdominal pain and masslike substance.  Patient reports she noticed this mass approximately 2 weeks ago, but it has gradually increased in size.  It typically is not bothersome to the patient, however today while she was in her water aerobics class she had to place a tight belt over her abdomen.  During that class, she noticed increased GERD symptoms including reflux.  However, when she took the belt off her symptoms resolved.  She decided to come to the emergency department to get everything checked out prior to the holidays.  She denies nausea or vomiting.  No dysuria or hematuria.  No changes to her bowel movements.  Physical exam was unremarkable.  There was mild abdominal tenderness on the right upper quadrant.  Murphy sign was negative.  Labs are unremarkable.  UA is unremarkable.  CT is unremarkable.  There is some evidence of cholelithiasis without cholecystitis.  On  physical exam, there is approximately a 2 cm area of nontender induration.  I believe this is likely a lipoma.  I will give patient a referral to general surgery, if she wishes to have this excised.  Patient has verbalized her understanding to this.  Vital signs are stable.  Patient is appropriate for discharge at this time.   Dispostion:  After consideration of the diagnostic results and the patients response to treatment, I feel that the patient would benefit from patient follow-up with general surgery.   Final diagnoses:  Lipoma of torso    ED Discharge Orders     None  Torrence Marry RAMAN, PA-C 10/14/24 2215    Neysa Caron PARAS, DO 10/14/24 2343

## 2024-10-19 DIAGNOSIS — H524 Presbyopia: Secondary | ICD-10-CM | POA: Diagnosis not present

## 2024-10-19 DIAGNOSIS — H35033 Hypertensive retinopathy, bilateral: Secondary | ICD-10-CM | POA: Diagnosis not present

## 2024-10-26 ENCOUNTER — Ambulatory Visit: Admitting: Physician Assistant

## 2024-10-26 DIAGNOSIS — D179 Benign lipomatous neoplasm, unspecified: Secondary | ICD-10-CM | POA: Diagnosis not present

## 2024-10-26 DIAGNOSIS — K8020 Calculus of gallbladder without cholecystitis without obstruction: Secondary | ICD-10-CM | POA: Diagnosis not present

## 2024-11-09 NOTE — Progress Notes (Unsigned)
 Office Visit    Patient Name: Penny Guzman Date of Encounter: 11/09/2024  PCP:  Nanci Senior, MD   [facility]  Cardiologist:  Powell FORBES Sorrow, MD (Inactive)  Advanced Practice Provider:  No care team member to display Electrophysiologist:  None   HPI    Penny Guzman is a 73 y.o. female with a past medical history of HTN, anal cancer (stage IIIc) status post XRT and treatment with 5-FU and mitomycin, aortic atherosclerosis and GERD presents today for follow-up appointment.  She was last seen May 2023 and at that time she was having decreased energy and exercise capacity over the past couple of years.  She used to be able to walk 10,000 steps per day but now is getting about 6000 steps per day and felt wiped out.  No associated chest pain but did endorse more fatigued than previous.  Continue to perform water aerobics and felt okay during class.  The day prior to her appointment, she suddenly developed severe pain radiating proximally from her feet in both lower extremities.  Her legs felt frozen and she felt nauseous.  She also complained of tingling weakness in her bilateral feet.  This was after she got up from a daily nap to use the restroom and she lie down again.  No symptoms with exertion.  Pain occurred during rest.  Additionally, endorses occasional chest pressure while laying down similar to panic attacks associated with dyspnea.  Does not have this sensation with exertion.  Rarely feels dizzy when standing up after bending or squatting.  Does have GERD treated with omeprazole.  Family history of her mother with CHF who died when she was 4 years old and was a non-smoker.  Completed chemotherapy in 2017.  I saw her 01/2023, she tells me that she has never had any chest pain or shortness of breath.  She has severe stenosis in her back which inhibits some of her physical activity.  She has no symptoms related to her heart.  No palpitations.  She was  told that she has aortic atherosclerosis and she does have a family history of CAD.  EKG stable today.  Reports no shortness of breath nor dyspnea on exertion. Reports no chest pain, pressure, or tightness. No edema, orthopnea, PND. Reports no palpitations.   Today, she ***   Past Medical History    Past Medical History:  Diagnosis Date   Colon cancer (HCC) 03/2016   anal cancer   Depression    GERD (gastroesophageal reflux disease)    Hypertension    Spinal stenosis    Past Surgical History:  Procedure Laterality Date   ovarian cysts      Allergies  Allergies  Allergen Reactions   Codeine     1998-09-06;stomach nausea   Propoxyphene     1998-09-06;stomach nausea   Triamcinolone      2002-08-15;TOPICAL: RASH    EKGs/Labs/Other Studies Reviewed:   The following studies were reviewed today:   CT Chest/Abdomen/Pelvis  03/31/2020: FINDINGS: CT CHEST FINDINGS   Cardiovascular: Coronary artery calcification and aortic atherosclerotic calcification.   Mediastinum/Nodes: No axillary or supraclavicular adenopathy. No mediastinal or hilar adenopathy. No pericardial effusion. Small hiatal hernia.   Lungs/Pleura: No suspicious pulmonary nodularity.  Airways normal.   Musculoskeletal: No aggressive osseous lesion.   CT ABDOMEN AND PELVIS FINDINGS   Hepatobiliary: No focal hepatic lesion.  Several gallstones noted.   Pancreas: Pancreas is normal. No ductal dilatation. No pancreatic inflammation.  Spleen: Normal spleen   Adrenals/urinary tract: Adrenal glands and kidneys are normal. The ureters and bladder normal.   Stomach/Bowel: Stomach, small-bowel, cecum normal. Appendix not identified. Ascending, transverse and descending colon normal. Rectosigmoid colon normal. No mesenteric adenopathy. No perirectal adenopathy.   Vascular/Lymphatic: Abdominal aorta is normal caliber with atherosclerotic calcification. There is no retroperitoneal or periportal  lymphadenopathy. No pelvic lymphadenopathy. No inguinal adenopathy.   Reproductive: Uterus normal.  Adnexa unremarkable.   Other: No free fluid.   Musculoskeletal: No aggressive osseous lesion. Degenerative spurring of the lumbar spine. Endplate sclerosis.   IMPRESSION: Chest Impression:   1. No evidence of pulmonary metastasis. 2. Coronary artery calcification and Aortic Atherosclerosis (ICD10-I70.0).   Abdomen / Pelvis Impression:   1. No evidence local anal carcinoma recurrence. 2. No metastatic adenopathy or visceral metastasis. 3. Cholelithiasis without evidence cholecystitis. 4.  Aortic Atherosclerosis (ICD10-I70.0).  EKG:  EKG is  ordered today.  The ekg ordered today demonstrates NSR 78bpm  Recent Labs: 10/14/2024: ALT 31; BUN 23; Creatinine, Ser 0.65; Hemoglobin 13.7; Platelets 276; Potassium 3.9; Sodium 140  Recent Lipid Panel    Component Value Date/Time   CHOL 212 (H) 05/06/2022 1038   TRIG 66 05/06/2022 1038   HDL 66 05/06/2022 1038   CHOLHDL 3.2 05/06/2022 1038   LDLCALC 134 (H) 05/06/2022 1038     Home Medications   No outpatient medications have been marked as taking for the 11/12/24 encounter (Appointment) with Lucien Orren SAILOR, PA-C.     Review of Systems      All other systems reviewed and are otherwise negative except as noted above.  Physical Exam    VS:  There were no vitals taken for this visit. , BMI There is no height or weight on file to calculate BMI.  Wt Readings from Last 3 Encounters:  02/05/23 160 lb 9.6 oz (72.8 kg)  04/24/22 151 lb (68.5 kg)  01/02/22 165 lb 2 oz (74.9 kg)     GEN: Well nourished, well developed, in no acute distress. HEENT: normal. Neck: Supple, no JVD, carotid bruits, or masses. Cardiac: RRR, no murmurs, rubs, or gallops. No clubbing, cyanosis, edema.  Radials/PT 2+ and equal bilaterally.  Respiratory:  Respirations regular and unlabored, clear to auscultation bilaterally. GI: Soft, nontender,  nondistended. MS: No deformity or atrophy. Skin: Warm and dry, no rash. Neuro:  Strength and sensation are intact. Psych: Normal affect.  Assessment & Plan    Precordial pain -resolved, per patient never had chest pressure -GERD symptoms well controlled on prilosec -Continue current medications which include atorvastatin  40 mg daily and losartan  25 mg daily  Shortness of breath -resolved ever an  issue per the patient  History of chemotherapy -stable  Hyperlipidemia/aortic atherosclerosis -lipid panel by PCP -triglycerides 66 -continue Lipitor 40mg  daily   CAD -asa stopped by Dr. Hobart -LDL above goal, would recommend follow-up lipid panel by PCP     Disposition: Follow up 1 year  with Powell FORBES Hobart, MD (Inactive) or APP.  Signed, Orren SAILOR Lucien, PA-C 11/09/2024, 2:31 PM [facility]

## 2024-11-12 ENCOUNTER — Encounter: Payer: Self-pay | Admitting: Physician Assistant

## 2024-11-12 ENCOUNTER — Ambulatory Visit: Attending: Physician Assistant | Admitting: Physician Assistant

## 2024-11-12 VITALS — BP 120/82 | HR 86 | Ht 65.0 in | Wt 174.0 lb

## 2024-11-12 DIAGNOSIS — Z79899 Other long term (current) drug therapy: Secondary | ICD-10-CM

## 2024-11-12 DIAGNOSIS — I700 Atherosclerosis of aorta: Secondary | ICD-10-CM

## 2024-11-12 DIAGNOSIS — R072 Precordial pain: Secondary | ICD-10-CM

## 2024-11-12 DIAGNOSIS — E785 Hyperlipidemia, unspecified: Secondary | ICD-10-CM | POA: Diagnosis not present

## 2024-11-12 DIAGNOSIS — R0602 Shortness of breath: Secondary | ICD-10-CM

## 2024-11-12 DIAGNOSIS — I2510 Atherosclerotic heart disease of native coronary artery without angina pectoris: Secondary | ICD-10-CM | POA: Diagnosis not present

## 2024-11-12 DIAGNOSIS — Z9221 Personal history of antineoplastic chemotherapy: Secondary | ICD-10-CM | POA: Diagnosis not present

## 2024-11-12 MED ORDER — ASPIRIN 81 MG PO TBEC: 81.0000 mg | DELAYED_RELEASE_TABLET | Freq: Every day | ORAL | Status: AC

## 2024-11-12 NOTE — Patient Instructions (Addendum)
 Medication Instructions:  RESTART Aspirin 81mg  Take 1 tablet once a day  *If you need a refill on your cardiac medications before your next appointment, please call your pharmacy*  Lab Work: AT YOUR CONVENIENCE - LIPIDS If you have labs (blood work) drawn today and your tests are completely normal, you will receive your results only by: MyChart Message (if you have MyChart) OR A paper copy in the mail If you have any lab test that is abnormal or we need to change your treatment, we will call you to review the results.  Testing/Procedures: NONE ORDERED  Follow-Up: At [facility], you and your health needs are our priority.  As part of our continuing mission to provide you with exceptional heart care, our providers are all part of one team.  This team includes your primary Cardiologist (physician) and Advanced Practice Providers or APPs (Physician Assistants and Nurse Practitioners) who all work together to provide you with the care you need, when you need it.  Your next appointment:   6 month(s)  Provider:   Joelle VEAR Ren Tasia, MD   We recommend signing up for the patient portal called MyChart.  Sign up information is provided on this After Visit Summary.  MyChart is used to connect with patients for Virtual Visits (Telemedicine).  Patients are able to view lab/test results, encounter notes, upcoming appointments, etc.  Non-urgent messages can be sent to your provider as well.   To learn more about what you can do with MyChart, go to [email or web address].   Other Instructions

## 2024-11-16 DIAGNOSIS — R300 Dysuria: Secondary | ICD-10-CM | POA: Diagnosis not present

## 2024-11-16 DIAGNOSIS — M7062 Trochanteric bursitis, left hip: Secondary | ICD-10-CM | POA: Diagnosis not present
